# Patient Record
Sex: Female | Born: 1985 | Race: Black or African American | Hispanic: No | Marital: Single | Smoking: Former smoker
Health system: Southern US, Community
[De-identification: ages and names within clinical notes are randomized; demographics above are authoritative.]

## PROBLEM LIST (undated history)

## (undated) ENCOUNTER — Emergency Department: Admission: EM | Payer: Self-pay | Source: Home / Self Care

## (undated) ENCOUNTER — Inpatient Hospital Stay: Payer: Self-pay

## (undated) DIAGNOSIS — N61 Mastitis without abscess: Secondary | ICD-10-CM

## (undated) DIAGNOSIS — Z789 Other specified health status: Secondary | ICD-10-CM

## (undated) DIAGNOSIS — R638 Other symptoms and signs concerning food and fluid intake: Secondary | ICD-10-CM

## (undated) DIAGNOSIS — R87629 Unspecified abnormal cytological findings in specimens from vagina: Secondary | ICD-10-CM

## (undated) DIAGNOSIS — D573 Sickle-cell trait: Secondary | ICD-10-CM

## (undated) HISTORY — PX: NO PAST SURGERIES: SHX2092

## (undated) HISTORY — DX: Mastitis without abscess: N61.0

## (undated) HISTORY — DX: Unspecified abnormal cytological findings in specimens from vagina: R87.629

---

## 2013-09-05 ENCOUNTER — Emergency Department: Payer: Self-pay | Admitting: Emergency Medicine

## 2013-09-05 LAB — RAPID INFLUENZA A&B ANTIGENS (ARMC ONLY)

## 2013-09-06 LAB — BETA STREP CULTURE(ARMC)

## 2014-05-08 ENCOUNTER — Emergency Department: Payer: Self-pay | Admitting: Emergency Medicine

## 2014-05-08 LAB — COMPREHENSIVE METABOLIC PANEL
Albumin: 3.5 g/dL (ref 3.4–5.0)
Alkaline Phosphatase: 69 U/L
Anion Gap: 8 (ref 7–16)
BUN: 9 mg/dL (ref 7–18)
Bilirubin,Total: 0.4 mg/dL (ref 0.2–1.0)
CO2: 25 mmol/L (ref 21–32)
Calcium, Total: 8.4 mg/dL — ABNORMAL LOW (ref 8.5–10.1)
Chloride: 107 mmol/L (ref 98–107)
Creatinine: 0.77 mg/dL (ref 0.60–1.30)
GLUCOSE: 93 mg/dL (ref 65–99)
Osmolality: 278 (ref 275–301)
Potassium: 3.3 mmol/L — ABNORMAL LOW (ref 3.5–5.1)
SGOT(AST): 21 U/L (ref 15–37)
SGPT (ALT): 18 U/L
Sodium: 140 mmol/L (ref 136–145)
TOTAL PROTEIN: 7.2 g/dL (ref 6.4–8.2)

## 2014-05-08 LAB — CBC WITH DIFFERENTIAL/PLATELET
BASOS ABS: 0 10*3/uL (ref 0.0–0.1)
BASOS PCT: 0.5 %
EOS ABS: 0.6 10*3/uL (ref 0.0–0.7)
Eosinophil %: 6.8 %
HCT: 39.1 % (ref 35.0–47.0)
HGB: 13 g/dL (ref 12.0–16.0)
Lymphocyte #: 3.2 10*3/uL (ref 1.0–3.6)
Lymphocyte %: 39.3 %
MCH: 27.6 pg (ref 26.0–34.0)
MCHC: 33.2 g/dL (ref 32.0–36.0)
MCV: 83 fL (ref 80–100)
MONO ABS: 0.4 x10 3/mm (ref 0.2–0.9)
Monocyte %: 4.6 %
NEUTROS PCT: 48.8 %
Neutrophil #: 4 10*3/uL (ref 1.4–6.5)
Platelet: 284 10*3/uL (ref 150–440)
RBC: 4.71 10*6/uL (ref 3.80–5.20)
RDW: 14.2 % (ref 11.5–14.5)
WBC: 8.1 10*3/uL (ref 3.6–11.0)

## 2014-05-08 LAB — URINALYSIS, COMPLETE
Bacteria: NONE SEEN
Bilirubin,UR: NEGATIVE
Blood: NEGATIVE
GLUCOSE, UR: NEGATIVE mg/dL (ref 0–75)
Ketone: NEGATIVE
NITRITE: NEGATIVE
Ph: 5 (ref 4.5–8.0)
Protein: NEGATIVE
RBC,UR: NONE SEEN /HPF (ref 0–5)
Specific Gravity: 1.018 (ref 1.003–1.030)
Squamous Epithelial: 3
WBC UR: 1 /HPF (ref 0–5)

## 2014-05-08 LAB — HCG, QUANTITATIVE, PREGNANCY: Beta Hcg, Quant.: 18131 m[IU]/mL — ABNORMAL HIGH

## 2014-06-16 LAB — OB RESULTS CONSOLE GC/CHLAMYDIA
CHLAMYDIA, DNA PROBE: NEGATIVE
Gonorrhea: NEGATIVE

## 2014-06-20 LAB — OB RESULTS CONSOLE HIV ANTIBODY (ROUTINE TESTING)
HIV: NONREACTIVE
HIV: NONREACTIVE

## 2014-06-20 LAB — OB RESULTS CONSOLE ABO/RH: RH TYPE: POSITIVE

## 2014-06-20 LAB — OB RESULTS CONSOLE GC/CHLAMYDIA
Chlamydia: NEGATIVE
Gonorrhea: NEGATIVE

## 2014-06-20 LAB — OB RESULTS CONSOLE PLATELET COUNT
PLATELETS: 290 10*3/uL
Platelets: 290 10*3/uL

## 2014-06-20 LAB — OB RESULTS CONSOLE HGB/HCT, BLOOD
HCT: 33 %
HCT: 33 %
Hemoglobin: 11.1 g/dL
Hemoglobin: 11.1 g/dL

## 2014-06-20 LAB — OB RESULTS CONSOLE RPR
RPR: NONREACTIVE
RPR: NONREACTIVE

## 2014-06-20 LAB — OB RESULTS CONSOLE RUBELLA ANTIBODY, IGM: Rubella: IMMUNE

## 2014-06-20 LAB — OB RESULTS CONSOLE HEPATITIS B SURFACE ANTIGEN: HEP B S AG: NEGATIVE

## 2014-06-20 LAB — OB RESULTS CONSOLE VARICELLA ZOSTER ANTIBODY, IGG: Varicella: IMMUNE

## 2014-06-20 LAB — OB RESULTS CONSOLE ANTIBODY SCREEN: Antibody Screen: NEGATIVE

## 2014-06-22 LAB — HM PAP SMEAR: HM PAP: POSITIVE

## 2014-06-26 ENCOUNTER — Emergency Department: Payer: Self-pay | Admitting: Emergency Medicine

## 2014-06-26 LAB — CBC
HCT: 36.3 % (ref 35.0–47.0)
HGB: 11.7 g/dL — AB (ref 12.0–16.0)
MCH: 27.2 pg (ref 26.0–34.0)
MCHC: 32.4 g/dL (ref 32.0–36.0)
MCV: 84 fL (ref 80–100)
PLATELETS: 252 10*3/uL (ref 150–440)
RBC: 4.32 10*6/uL (ref 3.80–5.20)
RDW: 14 % (ref 11.5–14.5)
WBC: 5.8 10*3/uL (ref 3.6–11.0)

## 2014-06-26 LAB — URINALYSIS, COMPLETE
BLOOD: NEGATIVE
Bacteria: NONE SEEN
Bilirubin,UR: NEGATIVE
Glucose,UR: NEGATIVE mg/dL (ref 0–75)
Ketone: NEGATIVE
Nitrite: NEGATIVE
PROTEIN: NEGATIVE
Ph: 8 (ref 4.5–8.0)
RBC,UR: 1 /HPF (ref 0–5)
Specific Gravity: 1.013 (ref 1.003–1.030)
Squamous Epithelial: 19

## 2014-06-26 LAB — WET PREP, GENITAL

## 2014-06-26 LAB — HCG, QUANTITATIVE, PREGNANCY: Beta Hcg, Quant.: 96825 m[IU]/mL — ABNORMAL HIGH

## 2014-06-27 LAB — GC/CHLAMYDIA PROBE AMP

## 2014-08-02 DIAGNOSIS — R87629 Unspecified abnormal cytological findings in specimens from vagina: Secondary | ICD-10-CM

## 2014-08-02 HISTORY — DX: Unspecified abnormal cytological findings in specimens from vagina: R87.629

## 2014-09-01 NOTE — L&D Delivery Note (Signed)
Delivery Note At 10:52 PM a viable female was delivered via Vaginal, Spontaneous Delivery (Presentation: OP;  ).  APGAR: , ; weight  .   Placenta status: Expressed, .  Cord: 3 vessels with the following complications: None.  Cord pH: N/A  Anesthesia: None  Episiotomy: None Lacerations: None Suture Repair:N/A Est. Blood Loss (mL):  350  Mom to postpartum.  Baby to Couplet care / Skin to Skin.  Kaileen Bronkema 01/02/2015, 11:54 PM

## 2014-12-07 LAB — OB RESULTS CONSOLE GBS
STREP GROUP B AG: NEGATIVE
STREP GROUP B AG: NEGATIVE

## 2015-01-02 ENCOUNTER — Encounter: Payer: Self-pay | Admitting: Anesthesiology

## 2015-01-02 ENCOUNTER — Inpatient Hospital Stay
Admission: RE | Admit: 2015-01-02 | Discharge: 2015-01-04 | DRG: 775 | Disposition: A | Discharge status: Home / Self Care | Payer: Medicaid Other | Attending: Obstetrics and Gynecology | Admitting: Obstetrics and Gynecology

## 2015-01-02 ENCOUNTER — Encounter: Payer: Self-pay | Admitting: *Deleted

## 2015-01-02 HISTORY — DX: Sickle-cell trait: D57.3

## 2015-01-02 HISTORY — DX: Other symptoms and signs concerning food and fluid intake: R63.8

## 2015-01-02 HISTORY — DX: Other specified health status: Z78.9

## 2015-01-02 LAB — CBC
HCT: 33.6 % — ABNORMAL LOW (ref 35.0–47.0)
Hemoglobin: 11.2 g/dL — ABNORMAL LOW (ref 12.0–16.0)
MCH: 26.3 pg (ref 26.0–34.0)
MCHC: 33.3 g/dL (ref 32.0–36.0)
MCV: 78.9 fL — ABNORMAL LOW (ref 80.0–100.0)
Platelets: 214 10*3/uL (ref 150–440)
RBC: 4.26 MIL/uL (ref 3.80–5.20)
RDW: 15 % — AB (ref 11.5–14.5)
WBC: 13.2 10*3/uL — ABNORMAL HIGH (ref 3.6–11.0)

## 2015-01-02 MED ORDER — OXYTOCIN 40 UNITS IN LACTATED RINGERS INFUSION - SIMPLE MED
1.0000 m[IU]/min | INTRAVENOUS | Status: DC
  Administered 2015-01-02: 4 m[IU]/min via INTRAVENOUS
  Administered 2015-01-02: 2 m[IU]/min via INTRAVENOUS

## 2015-01-02 MED ORDER — LIDOCAINE HCL (PF) 1 % IJ SOLN
INTRAMUSCULAR | Status: AC
  Filled 2015-01-02: qty 30

## 2015-01-02 MED ORDER — AMMONIA AROMATIC IN INHA
RESPIRATORY_TRACT | Status: AC
  Filled 2015-01-02: qty 10

## 2015-01-02 MED ORDER — OXYTOCIN 40 UNITS IN LACTATED RINGERS INFUSION - SIMPLE MED
INTRAVENOUS | Status: AC
  Filled 2015-01-02: qty 1000

## 2015-01-02 MED ORDER — TERBUTALINE SULFATE 1 MG/ML IJ SOLN: 0.2500 mg | Freq: Once | INTRAMUSCULAR | Status: AC | PRN

## 2015-01-02 MED ORDER — MISOPROSTOL 200 MCG PO TABS
ORAL_TABLET | ORAL | Status: AC
  Filled 2015-01-02: qty 1

## 2015-01-02 MED ORDER — FENTANYL 2.5 MCG/ML W/ROPIVACAINE 0.2% IN NS 100 ML EPIDURAL INFUSION (ARMC-ANES)
EPIDURAL | Status: AC
  Filled 2015-01-02: qty 100

## 2015-01-02 MED ORDER — OXYTOCIN 10 UNIT/ML IJ SOLN
INTRAMUSCULAR | Status: AC
  Filled 2015-01-02: qty 1

## 2015-01-02 NOTE — ED Notes (Signed)
Pt was discharged to l&d via wc

## 2015-01-02 NOTE — Progress Notes (Signed)
Terri Bell is a 29 y.o. G2P0 at Unknown by ultrasound admitted for active labor  Subjective:   Objective: There were no vitals taken for this visit.      FHT:  FHR: reassuring; h/o prior 2.5 min deceleration, resolved   SVE:    4-5/90/-3/AROM Lt Mec  Labs: Lab Results  Component Value Date   WBC 5.8 06/26/2014   HGB 11.7* 06/26/2014   HCT 36.3 06/26/2014   MCV 84 06/26/2014   PLT 252 06/26/2014    Assessment / Plan: AROM done for spontaneous deceleration. IUPC placed.  Labor: Pitocin augmentation as needed  DEFRANCESCO, MARTIN 01/02/2015, 6:30 PM

## 2015-01-02 NOTE — Anesthesia Preprocedure Evaluation (Deleted)
Anesthesia Evaluation  Patient identified by MRN, date of birth, ID band Patient awake    Reviewed: Allergy & Precautions, H&P , NPO status , Patient's Chart, lab work & pertinent test results  Airway        Dental   Pulmonary          Cardiovascular     Neuro/Psych    GI/Hepatic   Endo/Other    Renal/GU      Musculoskeletal   Abdominal   Peds  Hematology   Anesthesia Other Findings   Reproductive/Obstetrics                             Anesthesia Physical Anesthesia Plan Anesthesia Quick Evaluation

## 2015-01-02 NOTE — H&P (Signed)
Terri PiliKarena M Bell is a 29 y.o. female presenting for Labor management. Maternal Medical History:  Reason for admission: Contractions.   Contractions: Onset was 3-5 hours ago.   Frequency: irregular.   Perceived severity is moderate.    Fetal activity: Perceived fetal activity is normal.   Last perceived fetal movement was within the past hour.      OB History    Gravida Para Term Preterm AB TAB SAB Ectopic Multiple Living   2         1     Past Medical History  Diagnosis Date  . Medical history non-contributory    Past Surgical History  Procedure Laterality Date  . No past surgeries     Family History: family history is not on file. Social History:  reports that she has never smoked. She has never used smokeless tobacco. She reports that she does not drink alcohol or use illicit drugs.   Prenatal Transfer Tool  Maternal Diabetes: No Genetic Screening: Normal Maternal Ultrasounds/Referrals: Normal Fetal Ultrasounds or other Referrals:  None Maternal Substance Abuse:  No Significant Maternal Medications:  None Significant Maternal Lab Results:  None Other Comments:  None  Review of Systems  All other systems reviewed and are negative.   Dilation: 4 Effacement (%): 90 Station: -1 Exam by:: Defrancesco There were no vitals taken for this visit. Maternal Exam:  Uterine Assessment: Contraction strength is moderate.  Contraction frequency is irregular.   Abdomen: Patient reports no abdominal tenderness. Fundal height is 41.   Fetal presentation: vertex  Introitus: Normal vulva. Normal vagina.  Pelvis: adequate for delivery.   Cervix: Cervix evaluated by digital exam.     Physical Exam  Constitutional: She is oriented to person, place, and time. She appears well-developed and well-nourished.  HENT:  Head: Normocephalic and atraumatic.  Neck: Neck supple.  Cardiovascular: Normal rate and regular rhythm.   Respiratory: Breath sounds normal.  GI: There is no  tenderness.  Genitourinary: Vagina normal and uterus normal.  Musculoskeletal: Normal range of motion.  Neurological: She is alert and oriented to person, place, and time.  Skin: Skin is warm and dry.    Prenatal labs: ABO, Rh: B/Positive/-- (10/20 0000) Antibody: Negative (10/20 0000) Rubella: Immune (10/20 0000) RPR: Nonreactive, Nonreactive (10/20 0000)  HBsAg: Negative (10/20 0000)  HIV: Non-reactive, Non-reactive (10/20 0000)  GBS: Negative, Negative (04/07 0000)   Assessment/Plan: 1. TIUP in Labor 2. Sickle Trait 3. Increased BMI  Anticipate SVD  DEFRANCESCO, MARTIN 01/02/2015, 6:57 PM

## 2015-01-03 ENCOUNTER — Encounter: Payer: Self-pay | Admitting: *Deleted

## 2015-01-03 LAB — CBC
HCT: 34.6 % — ABNORMAL LOW (ref 35.0–47.0)
Hemoglobin: 11.2 g/dL — ABNORMAL LOW (ref 12.0–16.0)
MCH: 25.9 pg — ABNORMAL LOW (ref 26.0–34.0)
MCHC: 32.4 g/dL (ref 32.0–36.0)
MCV: 79.9 fL — ABNORMAL LOW (ref 80.0–100.0)
PLATELETS: 222 10*3/uL (ref 150–440)
RBC: 4.33 MIL/uL (ref 3.80–5.20)
RDW: 15.1 % — AB (ref 11.5–14.5)
WBC: 19 10*3/uL — AB (ref 3.6–11.0)

## 2015-01-03 MED ORDER — OXYTOCIN 40 UNITS IN LACTATED RINGERS INFUSION - SIMPLE MED
999.0000 mL/h | Freq: Once | INTRAVENOUS | Status: DC
Start: 2015-01-03 — End: 2015-01-03

## 2015-01-03 MED ORDER — OXYCODONE-ACETAMINOPHEN 5-325 MG PO TABS: 2.0000 | ORAL_TABLET | ORAL | Status: DC | PRN

## 2015-01-03 MED ORDER — MEASLES, MUMPS & RUBELLA VAC ~~LOC~~ INJ: 0.5000 mL | INJECTION | Freq: Once | SUBCUTANEOUS | Status: DC

## 2015-01-03 MED ORDER — ACETAMINOPHEN 325 MG PO TABS: 650.0000 mg | ORAL_TABLET | ORAL | Status: DC | PRN

## 2015-01-03 MED ORDER — DIBUCAINE 1 % RE OINT: 1.0000 "application " | TOPICAL_OINTMENT | RECTAL | Status: DC | PRN

## 2015-01-03 MED ORDER — TETANUS-DIPHTH-ACELL PERTUSSIS 5-2.5-18.5 LF-MCG/0.5 IM SUSP: 0.5000 mL | INTRAMUSCULAR | Status: DC | PRN

## 2015-01-03 MED ORDER — OXYTOCIN 10 UNIT/ML IJ SOLN: 40.0000 [IU] | INTRAVENOUS | Status: DC

## 2015-01-03 MED ORDER — OXYTOCIN 40 UNITS IN LACTATED RINGERS INFUSION - SIMPLE MED
999.0000 mL/h | Freq: Once | INTRAVENOUS | Status: AC
  Administered 2015-01-02: 999 mL/h via INTRAVENOUS

## 2015-01-03 MED ORDER — IBUPROFEN 800 MG PO TABS
800.0000 mg | ORAL_TABLET | Freq: Three times a day (TID) | ORAL | Status: DC
  Administered 2015-01-03 – 2015-01-04 (×2): 800 mg via ORAL
  Filled 2015-01-03 (×3): qty 1

## 2015-01-03 MED ORDER — ONDANSETRON HCL 4 MG PO TABS: 4.0000 mg | ORAL_TABLET | ORAL | Status: DC | PRN

## 2015-01-03 MED ORDER — OXYCODONE-ACETAMINOPHEN 5-325 MG PO TABS
1.0000 | ORAL_TABLET | ORAL | Status: DC | PRN
Start: 2015-01-03 — End: 2015-01-04

## 2015-01-03 MED ORDER — WITCH HAZEL-GLYCERIN EX PADS: 1.0000 "application " | MEDICATED_PAD | CUTANEOUS | Status: DC | PRN

## 2015-01-03 MED ORDER — LANOLIN HYDROUS EX OINT
TOPICAL_OINTMENT | CUTANEOUS | Status: DC | PRN
Start: 2015-01-03 — End: 2015-01-04

## 2015-01-03 MED ORDER — FERROUS SULFATE 325 (65 FE) MG PO TABS
325.0000 mg | ORAL_TABLET | Freq: Two times a day (BID) | ORAL | Status: DC
  Administered 2015-01-03 – 2015-01-04 (×3): 325 mg via ORAL
  Filled 2015-01-03 (×3): qty 1

## 2015-01-03 MED ORDER — DIPHENHYDRAMINE HCL 25 MG PO CAPS: 25.0000 mg | ORAL_CAPSULE | Freq: Four times a day (QID) | ORAL | Status: DC | PRN

## 2015-01-03 MED ORDER — BENZOCAINE-MENTHOL 20-0.5 % EX AERO: 1.0000 "application " | INHALATION_SPRAY | CUTANEOUS | Status: DC | PRN

## 2015-01-03 MED ORDER — ONDANSETRON HCL 4 MG/2ML IJ SOLN: 4.0000 mg | INTRAMUSCULAR | Status: DC | PRN

## 2015-01-03 MED ORDER — SENNOSIDES-DOCUSATE SODIUM 8.6-50 MG PO TABS
2.0000 | ORAL_TABLET | ORAL | Status: DC
  Administered 2015-01-03 – 2015-01-04 (×2): 2 via ORAL
  Filled 2015-01-03 (×3): qty 2

## 2015-01-03 MED ORDER — PRENATAL MULTIVITAMIN CH
1.0000 | ORAL_TABLET | Freq: Every day | ORAL | Status: DC
  Administered 2015-01-03: 1 via ORAL
  Filled 2015-01-03: qty 1

## 2015-01-03 MED ORDER — SIMETHICONE 80 MG PO CHEW: 80.0000 mg | CHEWABLE_TABLET | ORAL | Status: DC | PRN

## 2015-01-03 NOTE — Progress Notes (Signed)
Post Partum Day 0 Subjective: no complaints  Objective: Blood pressure 108/67, pulse 73, temperature 98.6 F (37 C), temperature source Oral, resp. rate 20, height 5\' 2"  (1.575 m), SpO2 98 %, unknown if currently breastfeeding.  Physical Exam:  General: cooperative and no distress Lochia: appropriate Uterine Fundus: firm Incision: N/A DVT Evaluation: No evidence of DVT seen on physical exam.   Recent Labs  01/02/15 2229 01/03/15 0557  HGB 11.2* 11.2*  HCT 33.6* 34.6*    Assessment/Plan: Plan for discharge tomorrow   LOS: 1 day   Terri Bell 01/03/2015, 7:45 AM

## 2015-01-04 LAB — RPR: RPR: NONREACTIVE

## 2015-01-04 MED ORDER — OXYCODONE-ACETAMINOPHEN 5-325 MG PO TABS: 1.0000 | ORAL_TABLET | ORAL | Status: DC | PRN

## 2015-01-04 MED ORDER — IBUPROFEN 800 MG PO TABS: 800.0000 mg | ORAL_TABLET | Freq: Three times a day (TID) | ORAL | Status: DC

## 2015-01-04 NOTE — Discharge Instructions (Signed)
Please call to schedule a 6 week post partum check up with your prenatal care provider

## 2015-01-10 NOTE — Discharge Summary (Signed)
Physician Obstetric Discharge Summary  Patient ID: Terri Bell Leclere MRN: [id number] DOB/AGE: May 02, 1986 29 y.o.   Date of Admission: 01/02/2015  Date of Discharge: 01/04/2015  Admitting Diagnosis: Induction of labor at 5539w3d    Mode of Delivery: normal spontaneous vaginal delivery5/3/16  SVD     Discharge Diagnosis: TIUP, delivered; 's Additions trait;Increased BMI; S/P SVD   Intrapartum Procedures: Atificial rupture of membranes and pitocin augmentation   Post partum procedures: NONE  Complications: none   Brief Hospital Course  Terri Bell Simons is a [id number] who had a SVD on 5/3//16;  for further details of this surgery, please refer to the delivey note.  Patient had an uncomplicated postpartum course.  By time of discharge on PPD#2, her pain was controlled on oral pain medications; she had appropriate lochia and was ambulating, voiding without difficulty and tolerating regular diet.  She was deemed stable for discharge to home.     Labs: CBC Latest Ref Rng 01/03/2015 01/02/2015 06/26/2014  WBC 3.6 - 11.0 K/uL 19.0(H) 13.2(H) 5.8  Hemoglobin 12.0 - 16.0 g/dL 11.2(L) 11.2(L) 11.7(L)  Hematocrit 35.0 - 47.0 % 34.6(L) 33.6(L) 36.3  Platelets 150 - 440 K/uL 222 214 252   B  Physical exam:  Blood pressure 102/62, pulse 59, temperature 98.3 F (36.8 C), temperature source Oral, resp. rate 18, height 5\' 2"  (1.575 Bell), SpO2 99 %, unknown if currently breastfeeding. General: alert and no distress Lochia: appropriate Abdomen: soft, NT  Extremities: No evidence of DVT seen on physical exam. No lower extremity edema.  Discharge Instructions: Per After Visit Summary. Activity: Advance as tolerated. Pelvic rest for 6 weeks.  Also refer to After Visit Summary Diet: Regular Medications:   Medication List    TAKE these medications        calcium carbonate 500 MG chewable tablet  Commonly known as:  TUMS - dosed in mg elemental calcium  Chew 3 tablets by mouth 2 (two) times daily as needed for  indigestion or heartburn.     ibuprofen 800 MG tablet  Commonly known as:  ADVIL,MOTRIN  Take 1 tablet (800 mg total) by mouth 3 (three) times daily.     oxyCODONE-acetaminophen 5-325 MG per tablet  Commonly known as:  PERCOCET/ROXICET  Take 1-2 tablets by mouth every 4 (four) hours as needed (for pain scale greater than 7).     prenatal multivitamin Tabs tablet  Take 1 tablet by mouth daily.       Outpatient follow up:  Postpartum contraception: condoms  Discharged Condition: good  Discharged to: home   Newborn Data: Disposition:home with mother  Apgars: APGAR (1 MIN): 8   APGAR (5 MINS): 9   APGAR (10 MINS):    Baby Feeding: Breast  Jaynie CollinsUGONNA  ANYANWU, MD, FACOG Attending Obstetrician & Gynecologist Faculty Practice, [facility]

## 2015-02-15 ENCOUNTER — Ambulatory Visit (INDEPENDENT_AMBULATORY_CARE_PROVIDER_SITE_OTHER): Payer: Medicaid Other | Admitting: Obstetrics and Gynecology

## 2015-02-15 ENCOUNTER — Encounter: Payer: Self-pay | Admitting: Obstetrics and Gynecology

## 2015-02-15 DIAGNOSIS — Z30018 Encounter for initial prescription of other contraceptives: Secondary | ICD-10-CM

## 2015-02-15 DIAGNOSIS — R87610 Atypical squamous cells of undetermined significance on cytologic smear of cervix (ASC-US): Secondary | ICD-10-CM | POA: Insufficient documentation

## 2015-02-15 DIAGNOSIS — R87810 Cervical high risk human papillomavirus (HPV) DNA test positive: Secondary | ICD-10-CM

## 2015-02-15 DIAGNOSIS — R896 Abnormal cytological findings in specimens from other organs, systems and tissues: Secondary | ICD-10-CM

## 2015-02-15 DIAGNOSIS — D649 Anemia, unspecified: Secondary | ICD-10-CM

## 2015-02-15 DIAGNOSIS — IMO0002 Reserved for concepts with insufficient information to code with codable children: Secondary | ICD-10-CM

## 2015-02-15 NOTE — Patient Instructions (Signed)
1.  Resume all activities without restriction. 2.  Begin using NuvaRing monthly; use backup condoms for the first month. 3.  CBC to assess anemia. 4.  RTC 3 months for colposcopy to assess ASCUS/positive Pap smear. 5.  RTC 6 months for annual exam.

## 2015-02-15 NOTE — Progress Notes (Signed)
Patient ID: Terri Bell, female   DOB: Jul 17, 1986, 29 y.o.   MRN: [id number]   Subjective:    Terri Bell is a 29 y.o. [id number] African American female who presents for a postpartum visit. She is 6  weeks postpartum following a spontaneous vaginal delivery at 39 gestational weeks. Anesthesia: none. I have fully reviewed the prenatal and intrapartum course. Postpartum course has been normal . Baby's course has been normal. Baby is feeding by bottle - Enfamil with Iron. Bleeding no bleeding. Bowel function is normal. Bladder function is normal. Patient is not sexually active. Last sexual activity: before 6 months ago. Contraception method is Nuvaring. Postpartum depression screening: negative. Score 5.  Last pap 06/22/2014 and was ascus/pos.  The following portions of the patient's history were reviewed and updated as appropriate: allergies, current medications, past medical history, past surgical history and problem list.  Review of Systems Pertinent items are noted in HPI.   There were no vitals filed for this visit. No LMP recorded.  Objective:   General:  alert, cooperative and no distress   Breasts:  Normal exam without masses, adenopathy or nipple discharge  Lungs: clear to auscultation bilaterally  Heart:  regular rate and rhythm  Abdomen: soft, non tender   Vulva: normal  Vagina: normal vagina  Cervix:  closed  Corpus: Well-involuted  Adnexa:  Non-palpable  Rectal Exam: No hemorrhoids        Assessment:   Postpartum exam 6 wks s/p  Bottle feeding Depression screening-negative Contraception counseling -NuvaRing  Started Abnormal Pap smear  Plan:   Contraception: NuvaRing Follow up in: 6 months for annual exam or earlier if needed Three-month colposcopy for 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

## 2015-02-16 MED ORDER — ETONOGESTREL-ETHINYL ESTRADIOL 0.12-0.015 MG/24HR VA RING: VAGINAL_RING | VAGINAL | Status: DC

## 2015-05-17 ENCOUNTER — Ambulatory Visit (INDEPENDENT_AMBULATORY_CARE_PROVIDER_SITE_OTHER): Payer: Medicaid Other | Admitting: Obstetrics and Gynecology

## 2015-05-17 ENCOUNTER — Encounter: Payer: Self-pay | Admitting: Obstetrics and Gynecology

## 2015-05-17 VITALS — BP 138/79 | HR 80 | Ht 62.5 in | Wt 174.8 lb

## 2015-05-17 DIAGNOSIS — R896 Abnormal cytological findings in specimens from other organs, systems and tissues: Secondary | ICD-10-CM

## 2015-05-17 DIAGNOSIS — IMO0002 Reserved for concepts with insufficient information to code with codable children: Secondary | ICD-10-CM

## 2015-05-17 NOTE — Progress Notes (Signed)
Patient ID: Terri Bell, female   DOB: 08-30-1986, 29 y.o.   MRN: [id number] colpo  ascus/pos   Chief complaint: 1.ASCUS/positive  Pap smear  Patient is a nonsmoker.  She does have multiple partners.  She is using NuvaRing for contraception  OBJECTIVE: BP 138/79 mmHg  Pulse 80  Ht 5' 2.5" (1.588 m)  Wt 174 lb 12.8 oz (79.289 kg)  BMI 31.44 kg/m2  LMP 05/08/2015  Breastfeeding? No Pelvic exam: External genitalia-normal BUS-normal Vagina- normal; No lesions Cervix-parous, eversion present, no lesions Uterus-retroverted.  Colposcopy Procedure Note  Indications: Pap smear 06/22/2014 showed: no abnormalities. The prior pap showed ASCUS with POSITIVE high risk HPV.  Prior cervical/vaginal disease: normal exam without visible pathology. Prior cervical treatment: no treatment.  Procedure Details  The risks and benefits of the procedure and Verbal informed consent obtained.  Speculum placed in vagina and excellent visualization of cervix achieved, cervix swabbed x 3 with acetic acid solution.  Findings: Cervix: no visible lesions; SCJ visualized 360 degrees without lesions and no biopsies taken. Vaginal inspection: vaginal colposcopy not performed. Vulvar colposcopy: vulvar colposcopy not performed.  Specimens: Pap, if ASCUS  Reflex  Complications: none.  Plan: Treatment options discussed with patient. Return in 6 months for repeat Pap smear.  Annual exam will be done at that time.  Herold Harms, MD

## 2015-05-17 NOTE — Addendum Note (Signed)
Addended by: Marchelle Folks on: 05/17/2015 10:46 AM   Modules accepted: Orders

## 2015-05-17 NOTE — Patient Instructions (Addendum)
1.  Return in 6 months for annual exam and Pap smear. 2.  Condom use with new partners.

## 2015-05-21 ENCOUNTER — Encounter: Payer: Self-pay | Admitting: Obstetrics and Gynecology

## 2015-05-21 LAB — PAP IG W/ RFLX HPV ASCU: PAP Smear Comment: 0

## 2015-05-22 ENCOUNTER — Encounter: Payer: Medicaid Other | Admitting: Obstetrics and Gynecology

## 2015-06-12 ENCOUNTER — Encounter: Payer: Self-pay | Admitting: Obstetrics and Gynecology

## 2015-06-12 ENCOUNTER — Ambulatory Visit (INDEPENDENT_AMBULATORY_CARE_PROVIDER_SITE_OTHER): Payer: Medicaid Other | Admitting: Obstetrics and Gynecology

## 2015-06-12 VITALS — BP 118/76 | HR 74 | Ht 62.0 in | Wt 173.8 lb

## 2015-06-12 DIAGNOSIS — A599 Trichomoniasis, unspecified: Secondary | ICD-10-CM

## 2015-06-12 MED ORDER — METRONIDAZOLE 500 MG PO TABS: 500.0000 mg | ORAL_TABLET | Freq: Two times a day (BID) | ORAL | Status: DC

## 2015-06-12 NOTE — Progress Notes (Signed)
Chief complaint: 1.  Abnormal Pap smear with Trichomonas vaginalis present. 2.  Vaginal discharge.  Patient presents for follow-up on vaginal discharge and Trichomonas vaginalis, which was seen on Pap smear.  She is just finishing her menses.  Now, but has had persistent discharge.  Past medical history, past surgical history, problem list, medications, allergies are reviewed.  Review of Systems  Constitutional: Negative.   Gastrointestinal: Negative.   Genitourinary: Negative.        Vaginal discharge present; just starting menses  Skin: Negative.   Endo/Heme/Allergies: Negative.    OBJECTIVE: BP 118/76 mmHg  Pulse 74  Ht  (1.575 m)  Wt 173 lb 12.8 oz (78.835 kg)  BMI 31.78 kg/m2  LMP 06/07/2015  Breastfeeding? No  Pleasant, well-appearing.  Abdomen rare female in no acute distress. Abdomen: Soft, nontender. Pelvic exam:  External genitalia-normal.  , BUS-normal.  Vagina-minimal bloody brown discharge  Cervix-normal  Uterus-normal  Procedure: Wet prep-normal saline: TNTC white blood cells; positive Trichomonas.  KOH: Negative  IMPRESSION: Trichomonas vaginalis.  PLAN: 1.  Metronidazole 500 mg twice a day for 7 days 2.  Partner needs treatment

## 2015-06-12 NOTE — Patient Instructions (Signed)
1.  Metronidazole 500 mg twice a day for 7 days. 2.  Partner needs to be treated in order to prevent recurrence of infection. 3.  Follow up as needed

## 2015-07-18 IMAGING — US US OB < 14 WEEKS
1 series · 14 of 28 positions shown · non-contrast
Comparison: 05/09/2014

CLINICAL DATA: First trimester pregnancy, bleeding for 24 hr,
discomfort, quantitative beta HCG [DATE], initial evaluation

EXAM:
OBSTETRIC <14 WK ULTRASOUND
TECHNIQUE: Transabdominal ultrasound was performed for evaluation of the
gestation as well as the maternal uterus and adnexal regions.

[Series 1: us ob < 14 weeks · 0.20mm/px · 38 acquisitions, 14 frames shown]
[im 2/38]
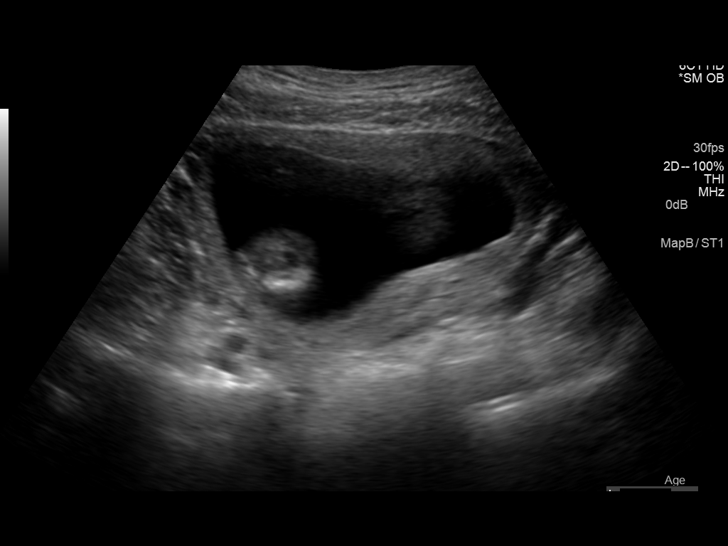
[im 5/38]
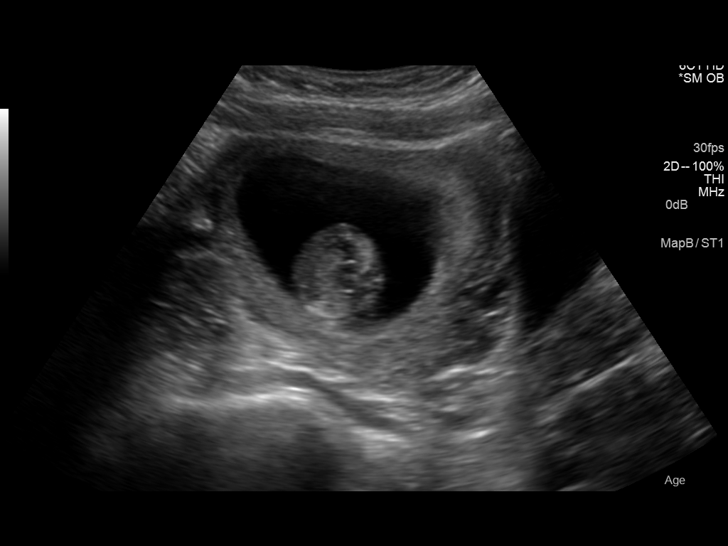
[im 7/38]
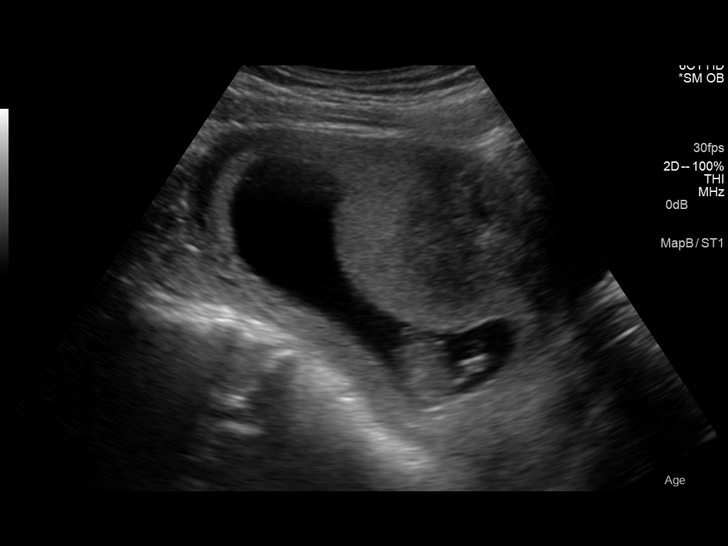
[im 10/38]
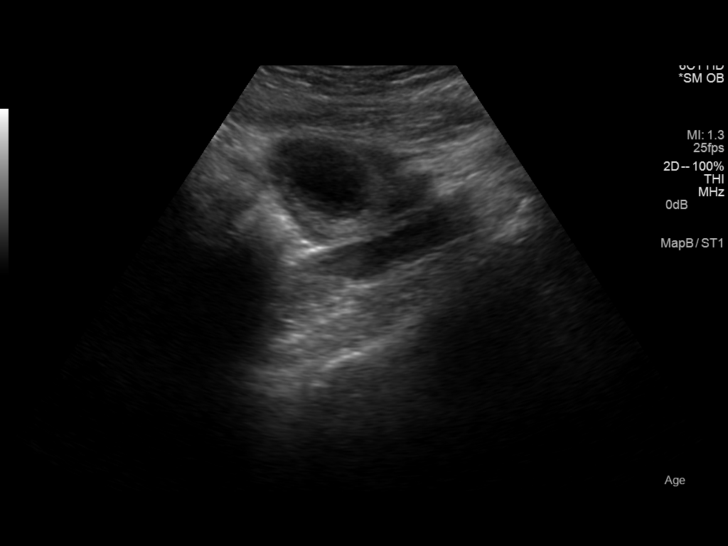
[im 13/38]
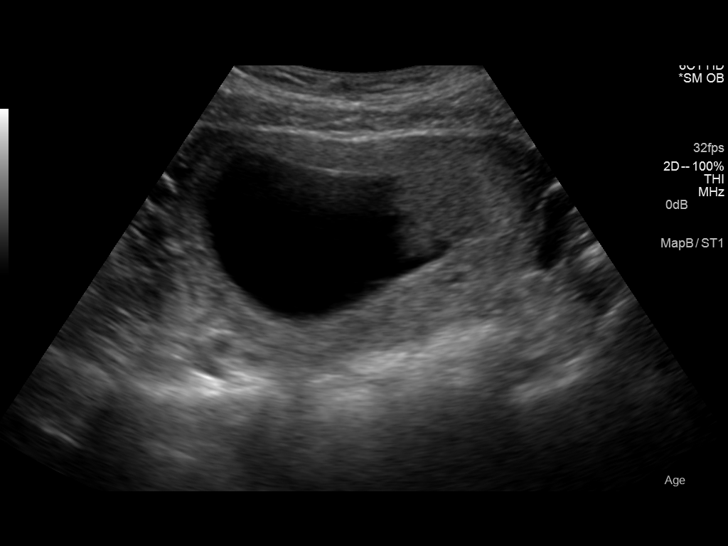
[im 16/38]
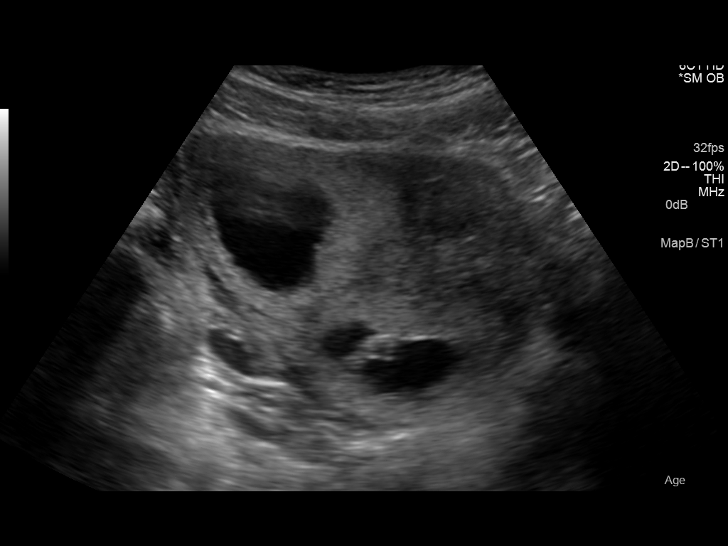
[im 18/38]
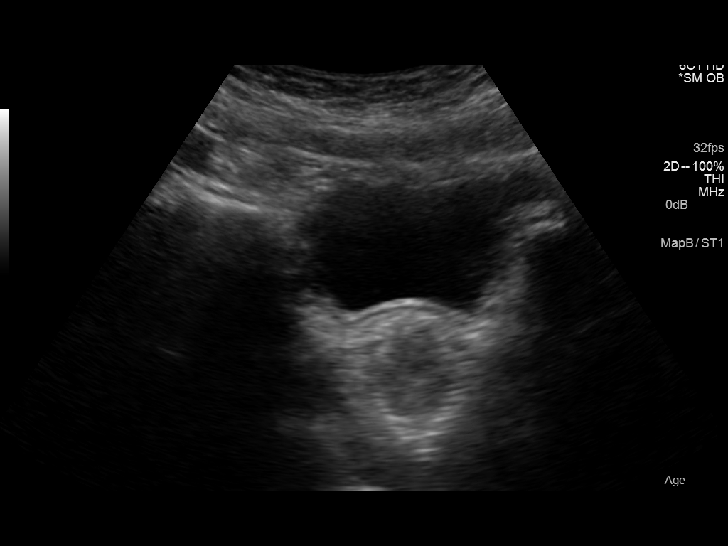
[im 21/38]
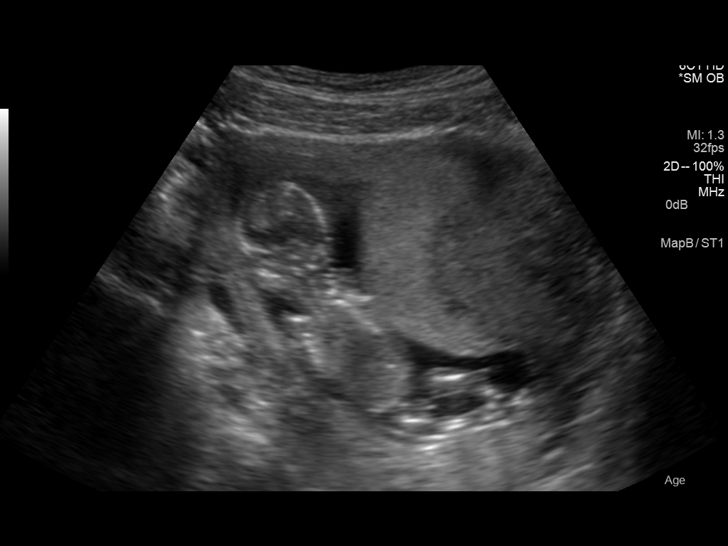
[im 24/38]
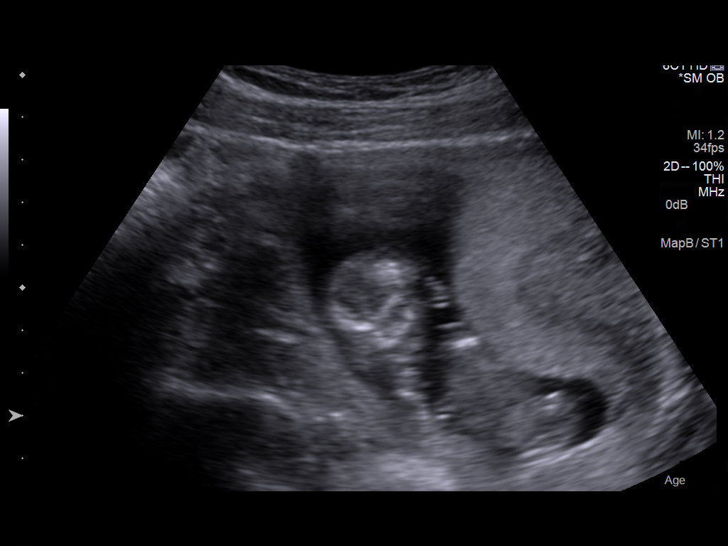
[im 27/38]
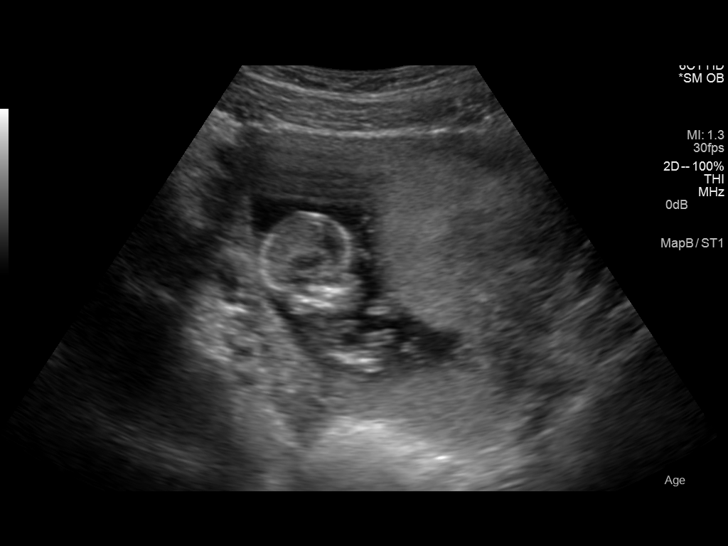
[im 29/38]
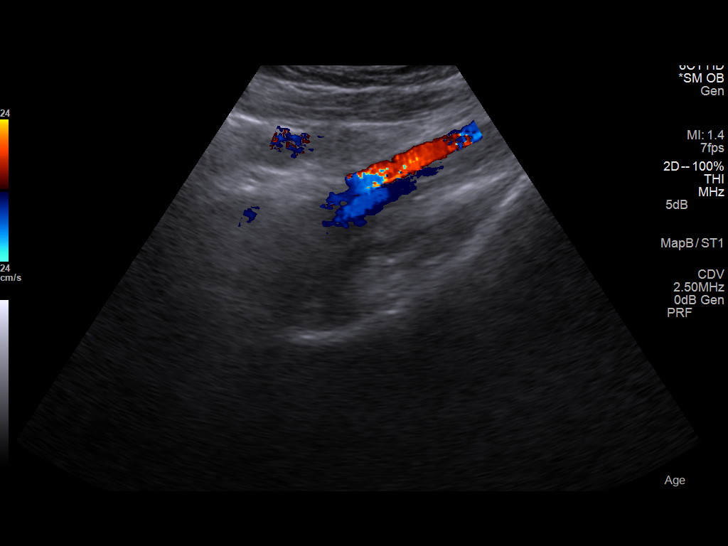
[im 32/38]
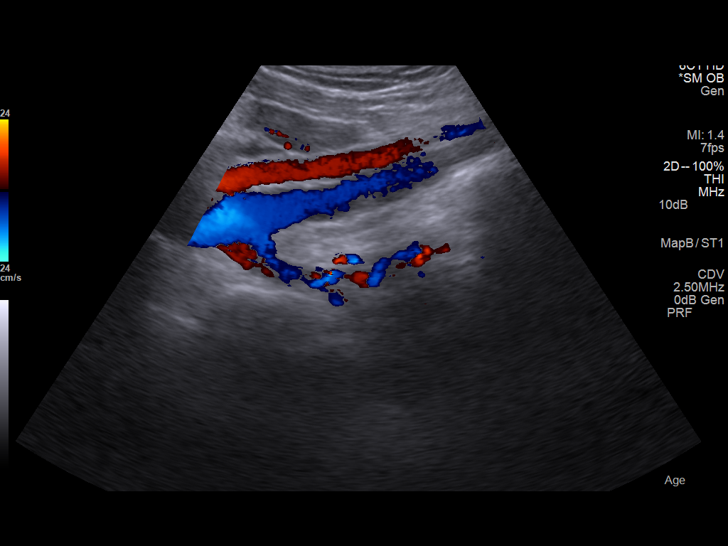
[im 35/38]
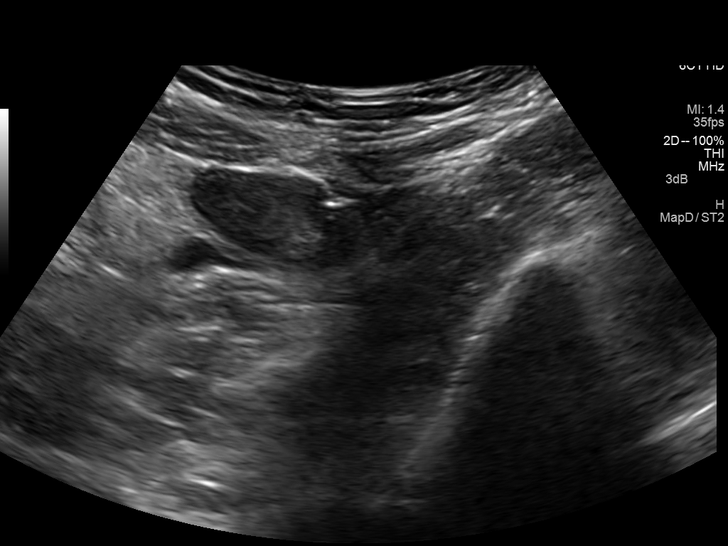
[im 38/38]
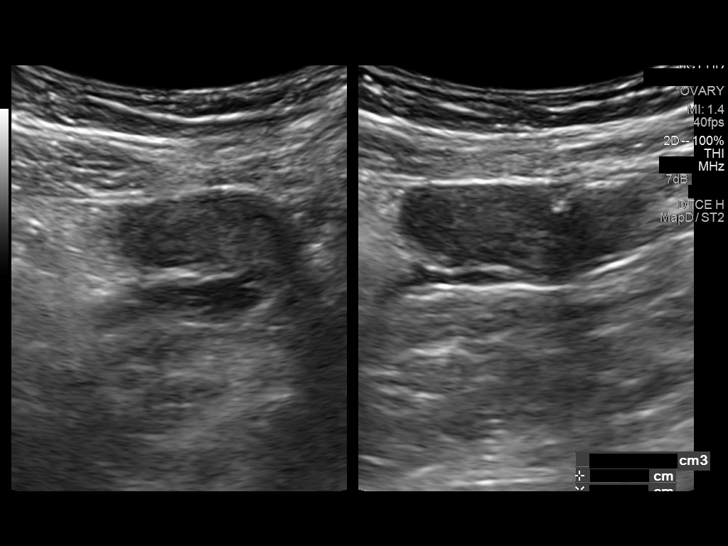

[14 of 28 positions shown; findings below may reference images not displayed]

FINDINGS: Intrauterine gestational sac: Visualized/normal in shape.

Yolk sac:  Not identified

Embryo:  Present

Cardiac Activity: Present

Heart Rate: 157 bpm

CRL:   64  mm   12 w 6 d                  US EDC: 01/29/2015

Maternal uterus/adnexae: No subchorionic hemorrhage. Normal left
ovary. No free fluid. Right ovary not identified.
IMPRESSION: Live intrauterine gestation estimated at 12 weeks 6 day gestational
age with no abnormalities.

## 2015-08-16 ENCOUNTER — Encounter: Payer: Medicaid Other | Admitting: Obstetrics and Gynecology

## 2015-09-02 NOTE — L&D Delivery Note (Signed)
Delivery Summary for Terri Bell  Labor Events:   Preterm labor:   Rupture date:   Rupture time:   Rupture type: Artificial  Fluid Color: Clear  Induction:   Augmentation:   Complications:   Cervical ripening:          Delivery:   Episiotomy:   Lacerations:   Repair suture:   Repair # of packets:   Blood loss (ml): 200   Information for the patient's newborn:  [name], Boy [name] [[id number]][[id number]]    Delivery 05/24/2016 12:14 PM by  Vaginal, Spontaneous Delivery Sex:  female Gestational Age: 6929w6d Delivery Clinician:   Living?:         APGARS  One minute Five minutes Ten minutes  Skin color:        Heart rate:        Grimace:        Muscle tone:        Breathing:        Totals: 9  9      Presentation/position:      Resuscitation:   Cord information:    Disposition of cord blood:     Blood gases sent?  Complications:   Placenta: Delivered:       appearance Newborn Measurements: Weight: 6 lb 13 oz (3090 g)  Height: 19.88"  Head circumference:    Chest circumference:    Other providers:    Additional  information: Forceps:   Vacuum:   Breech:   Observed anomalies        Delivery Note At 12:14 PM a viable and healthy female was delivered via Vaginal, Spontaneous Delivery (Presentation: Vertex; LOA position).  APGAR: 9, 9; weight 6 lb 13 oz (3090 g).   Placenta status: spontaneously removed, intact.  Cord: 3-vessel, with the following complications: None.  Cord pH: not obtained.   Anesthesia: None Episiotomy: None Lacerations: None Suture Repair: None Est. Blood Loss (mL):  200  Mom to postpartum.  Baby to Couplet care / Skin to Skin.  [name] [name] 05/24/2016, 1:51 PM      Hildred LaserAnika Gailene Youkhana, MD [facility]

## 2015-10-16 ENCOUNTER — Emergency Department: Payer: No Typology Code available for payment source

## 2015-10-16 ENCOUNTER — Emergency Department
Admission: EM | Admit: 2015-10-16 | Discharge: 2015-10-16 | Disposition: A | Discharge status: Home / Self Care | Payer: No Typology Code available for payment source | Attending: Emergency Medicine | Admitting: Emergency Medicine

## 2015-10-16 ENCOUNTER — Encounter: Payer: Self-pay | Admitting: Medical Oncology

## 2015-10-16 DIAGNOSIS — Y998 Other external cause status: Secondary | ICD-10-CM | POA: Diagnosis not present

## 2015-10-16 DIAGNOSIS — Z792 Long term (current) use of antibiotics: Secondary | ICD-10-CM | POA: Insufficient documentation

## 2015-10-16 DIAGNOSIS — Z793 Long term (current) use of hormonal contraceptives: Secondary | ICD-10-CM | POA: Insufficient documentation

## 2015-10-16 DIAGNOSIS — Y9389 Activity, other specified: Secondary | ICD-10-CM | POA: Insufficient documentation

## 2015-10-16 DIAGNOSIS — S8991XA Unspecified injury of right lower leg, initial encounter: Secondary | ICD-10-CM | POA: Diagnosis present

## 2015-10-16 DIAGNOSIS — S8001XA Contusion of right knee, initial encounter: Secondary | ICD-10-CM | POA: Insufficient documentation

## 2015-10-16 DIAGNOSIS — Y92410 Unspecified street and highway as the place of occurrence of the external cause: Secondary | ICD-10-CM | POA: Insufficient documentation

## 2015-10-16 DIAGNOSIS — Z880 Allergy status to penicillin: Secondary | ICD-10-CM | POA: Diagnosis not present

## 2015-10-16 MED ORDER — TRAMADOL HCL 50 MG PO TABS: 50.0000 mg | ORAL_TABLET | Freq: Four times a day (QID) | ORAL | Status: DC | PRN

## 2015-10-16 MED ORDER — IBUPROFEN 600 MG PO TABS: 600.0000 mg | ORAL_TABLET | Freq: Three times a day (TID) | ORAL | Status: DC | PRN
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
## 2015-10-16 NOTE — Discharge Instructions (Signed)
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
## 2015-10-22 ENCOUNTER — Telehealth: Payer: Self-pay | Admitting: Obstetrics and Gynecology

## 2015-10-22 MED ORDER — DOXYLAMINE SUCCINATE (SLEEP) 25 MG PO TABS: 12.5000 mg | ORAL_TABLET | Freq: Every evening | ORAL | Status: DC | PRN

## 2015-10-22 MED ORDER — VITAMIN B-6 25 MG PO TABS: 25.0000 mg | ORAL_TABLET | Freq: Three times a day (TID) | ORAL | Status: DC

## 2015-10-22 NOTE — Telephone Encounter (Signed)
Patient called requesting something for nausea. She doesn't come in for her nob nurse visit until 3/2. She uses the [facility] in [place].Thanks

## 2015-10-22 NOTE — Telephone Encounter (Signed)
Ob pt 7 1/7. C/o of nausea and vomiting. Able to hold liquids down. Has had 4-5 cups of h20 today. Feels weak d/t not eating. Sent in vit b6 and unisom. Can not send in diclegis d/t medicaid. Given n/v protocol. Will contact office on Thursday if no better.

## 2015-10-23 ENCOUNTER — Telehealth: Payer: Self-pay | Admitting: Obstetrics and Gynecology

## 2015-10-23 MED ORDER — ONDANSETRON HCL 4 MG PO TABS: 4.0000 mg | ORAL_TABLET | Freq: Three times a day (TID) | ORAL | Status: DC | PRN

## 2015-10-23 NOTE — Telephone Encounter (Signed)
PT CALLED AND SHE STATED THAT MEDICAID WILL NOT COVER WHAT WAS CALLED IN FOR HER MORNING SICKNESS.

## 2015-10-23 NOTE — Telephone Encounter (Signed)
B and unisom are OTC. Medicaid will not cover it. Sent in zofran. Pt will contact me if she can NOT get rx.

## 2015-11-13 ENCOUNTER — Ambulatory Visit (INDEPENDENT_AMBULATORY_CARE_PROVIDER_SITE_OTHER): Payer: Medicaid Other | Admitting: Obstetrics and Gynecology

## 2015-11-13 ENCOUNTER — Ambulatory Visit (INDEPENDENT_AMBULATORY_CARE_PROVIDER_SITE_OTHER): Payer: Medicaid Other

## 2015-11-13 VITALS — BP 105/75 | HR 73 | Wt 172.0 lb

## 2015-11-13 DIAGNOSIS — Z1379 Encounter for other screening for genetic and chromosomal anomalies: Secondary | ICD-10-CM

## 2015-11-13 DIAGNOSIS — Z3687 Encounter for antenatal screening for uncertain dates: Secondary | ICD-10-CM

## 2015-11-13 DIAGNOSIS — Z331 Pregnant state, incidental: Secondary | ICD-10-CM

## 2015-11-13 DIAGNOSIS — Z36 Encounter for antenatal screening of mother: Secondary | ICD-10-CM

## 2015-11-13 DIAGNOSIS — R638 Other symptoms and signs concerning food and fluid intake: Secondary | ICD-10-CM | POA: Diagnosis not present

## 2015-11-13 DIAGNOSIS — Z1389 Encounter for screening for other disorder: Secondary | ICD-10-CM | POA: Diagnosis not present

## 2015-11-13 DIAGNOSIS — Z369 Encounter for antenatal screening, unspecified: Secondary | ICD-10-CM

## 2015-11-13 DIAGNOSIS — Z3490 Encounter for supervision of normal pregnancy, unspecified, unspecified trimester: Secondary | ICD-10-CM

## 2015-11-13 DIAGNOSIS — O3110X0 Continuing pregnancy after spontaneous abortion of one fetus or more, unspecified trimester, not applicable or unspecified: Secondary | ICD-10-CM

## 2015-11-13 DIAGNOSIS — Z113 Encounter for screening for infections with a predominantly sexual mode of transmission: Secondary | ICD-10-CM

## 2015-11-13 NOTE — Progress Notes (Signed)
OB Consult: Patient is a [id number] who presents s/p ultrasound performed for viability/dating secondary to unsure LMP.  Ultrasound notes mono-di gestation, however notes fetal demise of twin B at [redacted] weeks gestation.  Twin A measuring at 3880w2d, viable. 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 Pregnancy was unplanned, but is desired. Due to type of twining, would recommend repeat scan in ~ 2 weeks for viability of remaining twin (discussed genetic testing with patient as well, she desires 1st trimester screen, can be performed at the same time). Will also have NOB visit at same time. All questions answered.  NOB labs ordered today.   A total of 15 minutes were spent face-to-face with the patient during this encounter and over half of that time dealt with counseling and coordination of care.

## 2015-11-13 NOTE — Progress Notes (Signed)
Terri PiliKarena M Bell presents for NOB nurse interview visit. G-5.  P-2022. Pregnancy was confirmed at [facility] but pt forgot her confirmation. In house pregnancy test today: positive. LMP: 09/02/2015. EDD:06/08/2016.Pt unsure of lmp and will have an ultrasound for viability and dating today at 2pm.  Pt having n/v and Zofran helping some. Encourage pt to take Vitamin B6. Does not want phenergan because it makes her sleepy. Pregnancy education material explained and given. NO cats in the home. NOB labs ordered. TSH/HbgA1c due to Increased BMI, HIV labs and Drug screen were explained optional and she could opt out of tests but did not decline. Drug screen ordered. PNV encouraged. NT ordered and pt want the Spina Bifida in 2nd trimester.  Pt. To follow up with provider in 2 weeks for NOB physical.  All questions answered.    ZIKA EXPOSURE SCREEN:  The patient has not traveled to a [place] endemic area within the past 6 months, nor has she had unprotected sex with a partner who has travelled to a [place] endemic region within the past 6 months. The patient has been advised to notify us if these factors change any time during this current pregnancy, so adequate testing and monitoring can be initiated.

## 2015-11-13 NOTE — Patient Instructions (Signed)
Pregnancy and Zika Virus Disease Zika virus disease, or Zika, is an illness that can spread to people from mosquitoes that carry the virus. It may also spread from person to person through infected body fluids. Zika first occurred in [place], but recently it has spread to new areas. The virus occurs in tropical climates. The location of Zika continues to change. Most people who become infected with Zika virus do not develop serious illness. However, Zika may cause birth defects in an unborn baby whose mother is infected with the virus. It may also increase the risk of miscarriage. WHAT ARE THE SYMPTOMS OF ZIKA VIRUS DISEASE? In many cases, people who have been infected with Zika virus do not develop any symptoms. If symptoms appear, they usually start about a week after the person is infected. Symptoms are usually mild. They may include:  Fever.  Rash.  Red eyes.  Joint pain. HOW DOES ZIKA VIRUS DISEASE SPREAD? The main way that Zika virus spreads is through the bite of a certain type of mosquito. Unlike most types of mosquitos, which bite only at night, the type of mosquito that carries Zika virus bites both at night and during the day. Zika virus can also spread through sexual contact, through a blood transfusion, and from a mother to her baby before or during birth. Once you have had Zika virus disease, it is unlikely that you will get it again. CAN I PASS ZIKA TO MY BABY DURING PREGNANCY? Yes, Zika can pass from a mother to her baby before or during birth. WHAT PROBLEMS CAN ZIKA CAUSE FOR MY BABY? A woman who is infected with Zika virus while pregnant is at risk of having her baby born with a condition in which the brain or head is smaller than expected (microcephaly). Babies who have microcephaly can have developmental delays, seizures, hearing problems, and vision problems. Having Zika virus disease during pregnancy can also increase the risk of miscarriage. HOW CAN ZIKA VIRUS DISEASE BE  PREVENTED? There is no vaccine to prevent Zika. The best way to prevent the disease is to avoid infected mosquitoes and avoid exposure to body fluids that can spread the virus. Avoid any possible exposure to Zika by taking the following precautions. For women and their sex partners:  Avoid traveling to high-risk areas. The locations where Zika is being reported change often. To identify high-risk areas, check the CDC travel website: www.cdc.gov/zika/geo/index.html  If you or your sex partner must travel to a high-risk area, talk with a health care provider before and after traveling.  Take all precautions to avoid mosquito bites if you live in, or travel to, any of the high-risk areas. Insect repellents are safe to use during pregnancy.  Ask your health care provider when it is safe to have sexual contact. For women:  If you are pregnant or trying to become pregnant, avoid sexual contact with persons who may have been exposed to Zika virus, persons who have possible symptoms of Zika, or persons whose history you are unsure about. If you choose to have sexual contact with someone who may have been exposed to Zika virus, use condoms correctly during the entire duration of sexual activity, every time. Do not share sexual devices, as you may be exposed to body fluids.  Ask your health care provider about when it is safe to attempt pregnancy after a possible exposure to Zika virus. WHAT STEPS SHOULD I TAKE TO AVOID MOSQUITO BITES? Take these steps to avoid mosquito bites when you are   in a high-risk area:  Wear loose clothing that covers your arms and legs.  Limit your outdoor activities.  Do not open windows unless they have window screens.  Sleep under mosquito nets.  Use insect repellent. The best insect repellents have:  DEET, picaridin, oil of lemon eucalyptus (OLE), or IR3535 in them.  Higher amounts of an active ingredient in them.  Remember that insect repellents are safe to use  during pregnancy.  Do not use OLE on children who are younger than 3 years of age. Do not use insect repellent on babies who are younger than 2 months of age.  Cover your child's stroller with mosquito netting. Make sure the netting fits snugly and that any loose netting does not cover your child's mouth or nose. Do not use a blanket as a mosquito-protection cover.  Do not apply insect repellent underneath clothing.  If you are using sunscreen, apply the sunscreen before applying the insect repellent.  Treat clothing with permethrin. Do not apply permethrin directly to your skin. Follow label directions for safe use.  Get rid of standing water, where mosquitoes may reproduce. Standing water is often found in items such as buckets, bowls, animal food dishes, and flowerpots. When you return from traveling to any high-risk area, continue taking actions to protect yourself against mosquito bites for 3 weeks, even if you show no signs of illness. This will prevent spreading Zika virus to uninfected mosquitoes. WHAT SHOULD I KNOW ABOUT THE SEXUAL TRANSMISSION OF ZIKA? People can spread Zika to their sexual partners during vaginal, anal, or oral sex, or by sharing sexual devices. Many people with Zika do not develop symptoms, so a person could spread the disease without knowing that they are infected. The greatest risk is to women who are pregnant or who may become pregnant. Zika virus can live longer in semen than it can live in blood. Couples can prevent sexual transmission of the virus by:  Using condoms correctly during the entire duration of sexual activity, every time. This includes vaginal, anal, and oral sex.  Not sharing sexual devices. Sharing increases your risk of being exposed to body fluid from another person.  Avoiding all sexual activity until your health care provider says it is safe. SHOULD I BE TESTED FOR ZIKA VIRUS? A sample of your blood can be tested for Zika virus. A pregnant  woman should be tested if she may have been exposed to the virus or if she has symptoms of Zika. She may also have additional tests done during her pregnancy, such ultrasound testing. Talk with your health care provider about which tests are recommended.   This information is not intended to replace advice given to you by your health care provider. Make sure you discuss any questions you have with your health care provider.   Document Released: 05/09/2015 Document Reviewed: 05/02/2015 Elsevier Interactive Patient Education 2016 [employer]. Minor Illnesses and Medications in Pregnancy  Cold/Flu:  Sudafed for congestion- Robitussin (plain) for cough- Tylenol for discomfort.  Please follow the directions on the label.  Try not to take any more than needed.  OTC Saline nasal spray and air humidifier or cool-mist  Vaporizer to sooth nasal irritation and to loosen congestion.  It is also important to increase intake of non carbonated fluids, especially if you have a fever.  Constipation:  Colace-2 capsules at bedtime; Metamucil- follow directions on label; Senokot- 1 tablet at bedtime.  Any one of these medications can be used.  It is also   very important to increase fluids and fruits along with regular exercise.  If problem persists please call the office.  Diarrhea:  Kaopectate as directed on the label.  Eat a bland diet and increase fluids.  Avoid highly seasoned foods.  Headache:  Tylenol 1 or 2 tablets every 3-4 hours as needed  Indigestion:  Maalox, Mylanta, Tums or Rolaids- as directed on label.  Also try to eat small meals and avoid fatty, greasy or spicy foods.  Nausea with or without Vomiting:  Nausea in pregnancy is caused by increased levels of hormones in the body which influence the digestive system and cause irritation when stomach acids accumulate.  Symptoms usually subside after 1st trimester of pregnancy.  Try the following:  Keep saltines, graham crackers or dry toast by your bed  to eat upon awakening.  Don't let your stomach get empty.  Try to eat 5-6 small meals per day instead of 3 large ones.  Avoid greasy fatty or highly seasoned foods.   Take OTC Unisom 1 tablet at bed time along with OTC Vitamin B6 25-50 mg 3 times per day.    If nausea continues with vomiting and you are unable to keep down food and fluids you may need a prescription medication.  Please notify your provider.   Sore throat:  Chloraseptic spray, throat lozenges and or plain Tylenol.  Vaginal Yeast Infection:  OTC Monistat for 7 days as directed on label.  If symptoms do not resolve within a week notify provider.  If any of the above problems do not subside with recommended treatment please call the office for further assistance.   Do not take Aspirin, Advil, Motrin or Ibuprofen.  * * OTC= Over the counter Hyperemesis Gravidarum Hyperemesis gravidarum is a severe form of nausea and vomiting that happens during pregnancy. Hyperemesis is worse than morning sickness. It may cause you to have nausea or vomiting all day for many days. It may keep you from eating and drinking enough food and liquids. Hyperemesis usually occurs during the first half (the first 20 weeks) of pregnancy. It often goes away once a woman is in her second half of pregnancy. However, sometimes hyperemesis continues through an entire pregnancy.  CAUSES  The cause of this condition is not completely known but is thought to be related to changes in the body's hormones when pregnant. It could be from the high level of the pregnancy hormone or an increase in estrogen in the body.  SIGNS AND SYMPTOMS   Severe nausea and vomiting.  Nausea that does not go away.  Vomiting that does not allow you to keep any food down.  Weight loss and body fluid loss (dehydration).  Having no desire to eat or not liking food you have previously enjoyed. DIAGNOSIS  Your health care provider will do a physical exam and ask you about your symptoms.  He or she may also order blood tests and urine tests to make sure something else is not causing the problem.  TREATMENT  You may only need medicine to control the problem. If medicines do not control the nausea and vomiting, you will be treated in the hospital to prevent dehydration, increased acid in the blood (acidosis), weight loss, and changes in the electrolytes in your body that may harm the unborn baby (fetus). You may need IV fluids.  HOME CARE INSTRUCTIONS   Only take over-the-counter or prescription medicines as directed by your health care provider.  Try eating a couple of dry crackers or   toast in the morning before getting out of bed.  Avoid foods and smells that upset your stomach.  Avoid fatty and spicy foods.  Eat 5-6 small meals a day.  Do not drink when eating meals. Drink between meals.  For snacks, eat high-protein foods, such as cheese.  Eat or suck on things that have ginger in them. Ginger helps nausea.  Avoid food preparation. The smell of food can spoil your appetite.  Avoid iron pills and iron in your multivitamins until after 3-4 months of being pregnant. However, consult with your health care provider before stopping any prescribed iron pills. SEEK MEDICAL CARE IF:   Your abdominal pain increases.  You have a severe headache.  You have vision problems.  You are losing weight. SEEK IMMEDIATE MEDICAL CARE IF:   You are unable to keep fluids down.  You vomit blood.  You have constant nausea and vomiting.  You have excessive weakness.  You have extreme thirst.  You have dizziness or fainting.  You have a fever or persistent symptoms for more than 2-3 days.  You have a fever and your symptoms suddenly get worse. MAKE SURE YOU:   Understand these instructions.  Will watch your condition.  Will get help right away if you are not doing well or get worse.   This information is not intended to replace advice given to you by your health care  provider. Make sure you discuss any questions you have with your health care provider.   Document Released: 08/18/2005 Document Revised: 06/08/2013 Document Reviewed: 03/30/2013 Elsevier Interactive Patient Education 2016 Elsevier Inc. Commonly Asked Questions During Pregnancy  Cats: A parasite can be excreted in cat feces.  To avoid exposure you need to have another person empty the little box.  If you must empty the litter box you will need to wear gloves.  Wash your hands after handling your cat.  This parasite can also be found in raw or undercooked meat so this should also be avoided.  Colds, Sore Throats, Flu: Please check your medication sheet to see what you can take for symptoms.  If your symptoms are unrelieved by these medications please call the office.  Dental Work: Most any dental work your dentist recommends is permitted.  X-rays should only be taken during the first trimester if absolutely necessary.  Your abdomen should be shielded with a lead apron during all x-rays.  Please notify your provider prior to receiving any x-rays.  Novocaine is fine; gas is not recommended.  If your dentist requires a note from us prior to dental work please call the office and we will provide one for you.  Exercise: Exercise is an important part of staying healthy during your pregnancy.  You may continue most exercises you were accustomed to prior to pregnancy.  Later in your pregnancy you will most likely notice you have difficulty with activities requiring balance like riding a bicycle.  It is important that you listen to your body and avoid activities that put you at a higher risk of falling.  Adequate rest and staying well hydrated are a must!  If you have questions about the safety of specific activities ask your provider.    Exposure to Children with illness: Try to avoid obvious exposure; report any symptoms to us when noted,  If you have chicken pos, red measles or mumps, you should be immune to  these diseases.   Please do not take any vaccines while pregnant unless you have checked with   your OB provider.  Fetal Movement: After 28 weeks we recommend you do "kick counts" twice daily.  Lie or sit down in a calm quiet environment and count your baby movements "kicks".  You should feel your baby at least 10 times per hour.  If you have not felt 10 kicks within the first hour get up, walk around and have something sweet to eat or drink then repeat for an additional hour.  If count remains less than 10 per hour notify your provider.  Fumigating: Follow your pest control agent's advice as to how long to stay out of your home.  Ventilate the area well before re-entering.  Hemorrhoids:   Most over-the-counter preparations can be used during pregnancy.  Check your medication to see what is safe to use.  It is important to use a stool softener or fiber in your diet and to drink lots of liquids.  If hemorrhoids seem to be getting worse please call the office.   Hot Tubs:  Hot tubs Jacuzzis and saunas are not recommended while pregnant.  These increase your internal body temperature and should be avoided.  Intercourse:  Sexual intercourse is safe during pregnancy as long as you are comfortable, unless otherwise advised by your provider.  Spotting may occur after intercourse; report any bright red bleeding that is heavier than spotting.  Labor:  If you know that you are in labor, please go to the hospital.  If you are unsure, please call the office and let us help you decide what to do.  Lifting, straining, etc:  If your job requires heavy lifting or straining please check with your provider for any limitations.  Generally, you should not lift items heavier than that you can lift simply with your hands and arms (no back muscles)  Painting:  Paint fumes do not harm your pregnancy, but may make you ill and should be avoided if possible.  Latex or water based paints have less odor than oils.  Use adequate  ventilation while painting.  Permanents & Hair Color:  Chemicals in hair dyes are not recommended as they cause increase hair dryness which can increase hair loss during pregnancy.  " Highlighting" and permanents are allowed.  Dye may be absorbed differently and permanents may not hold as well during pregnancy.  Sunbathing:  Use a sunscreen, as skin burns easily during pregnancy.  Drink plenty of fluids; avoid over heating.  Tanning Beds:  Because their possible side effects are still unknown, tanning beds are not recommended.  Ultrasound Scans:  Routine ultrasounds are performed at approximately 20 weeks.  You will be able to see your baby's general anatomy an if you would like to know the gender this can usually be determined as well.  If it is questionable when you conceived you may also receive an ultrasound early in your pregnancy for dating purposes.  Otherwise ultrasound exams are not routinely performed unless there is a medical necessity.  Although you can request a scan we ask that you pay for it when conducted because insurance does not cover " patient request" scans.  Work: If your pregnancy proceeds without complications you may work until your due date, unless your physician or employer advises otherwise.  Round Ligament Pain/Pelvic Discomfort:  Sharp, shooting pains not associated with bleeding are fairly common, usually occurring in the second trimester of pregnancy.  They tend to be worse when standing up or when you remain standing for long periods of time.  These are the result   of pressure of certain pelvic ligaments called "round ligaments".  Rest, Tylenol and heat seem to be the most effective relief.  As the womb and fetus grow, they rise out of the pelvis and the discomfort improves.  Please notify the office if your pain seems different than that described.  It may represent a more serious condition.   

## 2015-11-14 ENCOUNTER — Other Ambulatory Visit: Payer: Self-pay | Admitting: Obstetrics and Gynecology

## 2015-11-14 DIAGNOSIS — Z2839 Other underimmunization status: Secondary | ICD-10-CM

## 2015-11-14 DIAGNOSIS — O9989 Other specified diseases and conditions complicating pregnancy, childbirth and the puerperium: Principal | ICD-10-CM

## 2015-11-14 DIAGNOSIS — F1290 Cannabis use, unspecified, uncomplicated: Secondary | ICD-10-CM

## 2015-11-14 DIAGNOSIS — O09899 Supervision of other high risk pregnancies, unspecified trimester: Secondary | ICD-10-CM | POA: Insufficient documentation

## 2015-11-14 DIAGNOSIS — Z283 Underimmunization status: Secondary | ICD-10-CM | POA: Insufficient documentation

## 2015-11-14 LAB — CBC WITH DIFFERENTIAL/PLATELET
Basophils Absolute: 0 10*3/uL (ref 0.0–0.2)
Basos: 0 %
EOS (ABSOLUTE): 0.3 10*3/uL (ref 0.0–0.4)
Eos: 6 %
Hematocrit: 35.1 % (ref 34.0–46.6)
Hemoglobin: 12.2 g/dL (ref 11.1–15.9)
IMMATURE GRANULOCYTES: 0 %
Immature Grans (Abs): 0 10*3/uL (ref 0.0–0.1)
LYMPHS: 33 %
Lymphocytes Absolute: 1.8 10*3/uL (ref 0.7–3.1)
MCH: 27.4 pg (ref 26.6–33.0)
MCHC: 34.8 g/dL (ref 31.5–35.7)
MCV: 79 fL (ref 79–97)
MONOS ABS: 0.3 10*3/uL (ref 0.1–0.9)
Monocytes: 6 %
NEUTROS ABS: 2.9 10*3/uL (ref 1.4–7.0)
NEUTROS PCT: 55 %
PLATELETS: 288 10*3/uL (ref 150–379)
RBC: 4.46 x10E6/uL (ref 3.77–5.28)
RDW: 14.8 % (ref 12.3–15.4)
WBC: 5.3 10*3/uL (ref 3.4–10.8)

## 2015-11-14 LAB — URINALYSIS, ROUTINE W REFLEX MICROSCOPIC
BILIRUBIN UA: NEGATIVE
GLUCOSE, UA: NEGATIVE
KETONES UA: NEGATIVE
LEUKOCYTES UA: NEGATIVE
Nitrite, UA: NEGATIVE
PROTEIN UA: NEGATIVE
RBC UA: NEGATIVE
SPEC GRAV UA: 1.017 (ref 1.005–1.030)
Urobilinogen, Ur: 0.2 mg/dL (ref 0.2–1.0)
pH, UA: 6 (ref 5.0–7.5)

## 2015-11-14 LAB — PAIN MGT SCRN (14 DRUGS), UR
AMPHETAMINE SCRN UR: NEGATIVE ng/mL
BENZODIAZEPINE SCREEN, URINE: NEGATIVE ng/mL
Barbiturate Screen, Ur: NEGATIVE ng/mL
Buprenorphine, Urine: NEGATIVE ng/mL
CANNABINOIDS UR QL SCN: POSITIVE ng/mL
COCAINE(METAB.) SCREEN, URINE: NEGATIVE ng/mL
Creatinine(Crt), U: 120.5 mg/dL (ref 20.0–300.0)
FENTANYL, URINE: NEGATIVE pg/mL
Meperidine Screen, Urine: NEGATIVE ng/mL
Methadone Scn, Ur: NEGATIVE ng/mL
Opiate Scrn, Ur: NEGATIVE ng/mL
Oxycodone+Oxymorphone Ur Ql Scn: NEGATIVE ng/mL
PCP Scrn, Ur: NEGATIVE ng/mL
Ph of Urine: 5.8 (ref 4.5–8.9)
Propoxyphene, Screen: NEGATIVE ng/mL
TRAMADOL UR QL SCN: NEGATIVE ng/mL

## 2015-11-14 LAB — HIV ANTIBODY (ROUTINE TESTING W REFLEX): HIV SCREEN 4TH GENERATION: NONREACTIVE

## 2015-11-14 LAB — NICOTINE SCREEN, URINE: Cotinine Ql Scrn, Ur: POSITIVE ng/mL

## 2015-11-14 LAB — HEPATITIS B SURFACE ANTIGEN: Hepatitis B Surface Ag: NEGATIVE

## 2015-11-14 LAB — ANTIBODY SCREEN: Antibody Screen: NEGATIVE

## 2015-11-14 LAB — HEMOGLOBIN A1C
Est. average glucose Bld gHb Est-mCnc: 103 mg/dL
Hgb A1c MFr Bld: 5.2 % (ref 4.8–5.6)

## 2015-11-14 LAB — TSH: TSH: 0.542 u[IU]/mL (ref 0.450–4.500)

## 2015-11-14 LAB — RPR: RPR Ser Ql: NONREACTIVE

## 2015-11-14 LAB — RH TYPE: Rh Factor: POSITIVE

## 2015-11-14 LAB — RUBELLA ANTIBODY, IGM

## 2015-11-14 LAB — ABO

## 2015-11-14 LAB — VARICELLA ZOSTER ANTIBODY, IGM

## 2015-11-15 ENCOUNTER — Telehealth: Payer: Self-pay | Admitting: Obstetrics and Gynecology

## 2015-11-15 ENCOUNTER — Encounter: Payer: Medicaid Other | Admitting: Obstetrics and Gynecology

## 2015-11-15 DIAGNOSIS — O3110X0 Continuing pregnancy after spontaneous abortion of one fetus or more, unspecified trimester, not applicable or unspecified: Secondary | ICD-10-CM | POA: Insufficient documentation

## 2015-11-15 LAB — URINE CULTURE, OB REFLEX

## 2015-11-15 LAB — GC/CHLAMYDIA PROBE AMP
CHLAMYDIA, DNA PROBE: NEGATIVE
NEISSERIA GONORRHOEAE BY PCR: NEGATIVE

## 2015-11-15 LAB — CULTURE, OB URINE

## 2015-11-15 NOTE — Telephone Encounter (Signed)
Pt states that she is feeling pain on the left side of the abdomen, pt states that the pain came on suddenly. Pt states that the pain has been all day today. Pt denies bleeding. Pt states that she initially thought pains where hunger related, however has eaten and pain is still there. Advised pt on plain Tylenol 325mg  and warm baths for relief. Pt gave verbal understanding. Pt to call back if no relief.

## 2015-11-15 NOTE — Telephone Encounter (Signed)
PT IS [redacted] WK PREGNANT AND IS HAVING LOWER ABDOMINAL PAIN. COMING AND GOING PAIN SCALE 5 RIGHT NOW AND EARLIER PAIN SCALE ABOUT 8

## 2015-11-22 ENCOUNTER — Telehealth: Payer: Self-pay | Admitting: Obstetrics and Gynecology

## 2015-11-22 ENCOUNTER — Encounter: Payer: Self-pay | Admitting: Obstetrics and Gynecology

## 2015-11-22 NOTE — Telephone Encounter (Signed)
I spoke with Dr. Valentino Saxonherry and pt may come in tomorrow for u/a and possible uc but at this time time will have US as scheduled for 11/27/15 for NT and one of the twins demise. Appt set tomorrow at 11:15 am. Pt states she is hurting pretty bad and may go to ER if she is not feeling any better or worse. No vaginal bleeding.

## 2015-11-22 NOTE — Telephone Encounter (Signed)
Pt is [redacted] weeks pregnant and she is experiencing a lot of pelvic pain on both sides, no spotting or bleeding.

## 2015-11-23 ENCOUNTER — Ambulatory Visit (INDEPENDENT_AMBULATORY_CARE_PROVIDER_SITE_OTHER): Payer: Medicaid Other | Admitting: Obstetrics and Gynecology

## 2015-11-23 ENCOUNTER — Other Ambulatory Visit: Payer: Self-pay | Admitting: Obstetrics and Gynecology

## 2015-11-23 VITALS — BP 107/75 | HR 79 | Temp 99.0°F | Wt 171.0 lb

## 2015-11-23 DIAGNOSIS — O9989 Other specified diseases and conditions complicating pregnancy, childbirth and the puerperium: Secondary | ICD-10-CM | POA: Diagnosis not present

## 2015-11-23 DIAGNOSIS — R102 Pelvic and perineal pain: Secondary | ICD-10-CM

## 2015-11-23 DIAGNOSIS — R110 Nausea: Secondary | ICD-10-CM

## 2015-11-23 DIAGNOSIS — O26899 Other specified pregnancy related conditions, unspecified trimester: Secondary | ICD-10-CM

## 2015-11-23 LAB — POCT URINALYSIS DIPSTICK
BILIRUBIN UA: NEGATIVE
Glucose, UA: NEGATIVE
Ketones, UA: NEGATIVE
Nitrite, UA: NEGATIVE
PH UA: 6
Protein, UA: NEGATIVE
RBC UA: NEGATIVE
SPEC GRAV UA: 1.015
Urobilinogen, UA: NEGATIVE

## 2015-11-23 MED ORDER — PROMETHAZINE HCL 25 MG PO TABS: 25.0000 mg | ORAL_TABLET | Freq: Four times a day (QID) | ORAL | Status: DC | PRN

## 2015-11-23 NOTE — Progress Notes (Signed)
Pt came in today as scheduled to have ua done due to pelvic pain. UA results: +small leuks. No spotting. Some back pain. Has headache today. Noted nasal congestion and is to take Sudafed (ask pharmacist for it) as directed and Tylenol ES as directed. Will send urine for culture. Pt to keep appt on Tuesday for ultrasound. To contact office if fever above 100.8 and note relieved by Tylenol, if vaginal bleeding or if pain is severe. Pt desires phenergan for nausea so rx sent to pharmacy.

## 2015-11-23 NOTE — Progress Notes (Signed)
I have reviewed the documentation by Fenton Mallingebbie Ridgeway, LPN and concur with patient management and plan.  Terri LaserAnika Zyanna Leisinger, MD [facility]

## 2015-11-24 LAB — URINE CULTURE

## 2015-11-27 ENCOUNTER — Other Ambulatory Visit: Payer: Self-pay | Admitting: Obstetrics and Gynecology

## 2015-11-27 ENCOUNTER — Ambulatory Visit (INDEPENDENT_AMBULATORY_CARE_PROVIDER_SITE_OTHER): Payer: Medicaid Other

## 2015-11-27 ENCOUNTER — Ambulatory Visit (INDEPENDENT_AMBULATORY_CARE_PROVIDER_SITE_OTHER): Payer: Medicaid Other | Admitting: Obstetrics and Gynecology

## 2015-11-27 VITALS — BP 104/61 | HR 78 | Wt 169.7 lb

## 2015-11-27 DIAGNOSIS — Z3492 Encounter for supervision of normal pregnancy, unspecified, second trimester: Secondary | ICD-10-CM

## 2015-11-27 DIAGNOSIS — F1290 Cannabis use, unspecified, uncomplicated: Secondary | ICD-10-CM

## 2015-11-27 DIAGNOSIS — O3110X0 Continuing pregnancy after spontaneous abortion of one fetus or more, unspecified trimester, not applicable or unspecified: Secondary | ICD-10-CM

## 2015-11-27 DIAGNOSIS — O9989 Other specified diseases and conditions complicating pregnancy, childbirth and the puerperium: Secondary | ICD-10-CM

## 2015-11-27 DIAGNOSIS — Z283 Underimmunization status: Secondary | ICD-10-CM

## 2015-11-27 DIAGNOSIS — Z1379 Encounter for other screening for genetic and chromosomal anomalies: Secondary | ICD-10-CM

## 2015-11-27 DIAGNOSIS — R896 Abnormal cytological findings in specimens from other organs, systems and tissues: Secondary | ICD-10-CM

## 2015-11-27 DIAGNOSIS — O3121X0 Continuing pregnancy after intrauterine death of one fetus or more, first trimester, not applicable or unspecified: Secondary | ICD-10-CM

## 2015-11-27 DIAGNOSIS — F1210 Cannabis abuse, uncomplicated: Secondary | ICD-10-CM

## 2015-11-27 DIAGNOSIS — IMO0002 Reserved for concepts with insufficient information to code with codable children: Secondary | ICD-10-CM

## 2015-11-27 DIAGNOSIS — Z2839 Other underimmunization status: Secondary | ICD-10-CM

## 2015-11-27 LAB — POCT URINALYSIS DIPSTICK
Bilirubin, UA: 1
Blood, UA: NEGATIVE
GLUCOSE UA: NEGATIVE
Ketones, UA: NEGATIVE
Nitrite, UA: NEGATIVE
PROTEIN UA: NEGATIVE
SPEC GRAV UA: 1.015
UROBILINOGEN UA: 0.2
pH, UA: 6.5

## 2015-11-27 NOTE — Progress Notes (Signed)
Nob pe and nt. NEW OB HISTORY AND PHYSICAL   Subjective:       Terri Bell is a 30 y.o. [phone] female is here for gynecologic evaluation of the following issues:  1. New OB history and physical  Prenatal risk factors: 1. Vanishing twin; no bleeding; round ligament type pain 2. Nausea and vomiting, managed with Phenergan 3. Sickle cell carrier; father of baby negative 4. ASCUS/positive Pap smear history   Gynecologic History Patient's last menstrual period was 09/02/2015 (approximate). Contraception: none  Obstetric History OB History  Gravida Para Term Preterm AB SAB TAB Ectopic Multiple Living  0 2    # Outcome Date GA Lbr Len/2nd Weight Sex Delivery Anes PTL Lv  5 Current           4 SAB 08/2015        FD  3 Term 01/02/15 [redacted]w[redacted]d 09:03 / 00:19 8 lb 4.3 oz (3.751 kg) M Vag-Spont None  Y     Comments: none  2 SAB 10/2006        FD  1 Gravida 07/26/06   8 lb 5 oz (3.771 kg) M Vag-Spont EPI  Y    Obstetric Comments  08/2015 pt thinks she had miscarriage, not seen by physcian.    Past Medical History  Diagnosis Date  . Medical history non-contributory   . Sickle cell trait (HCC) increased bmi  . Increased BMI   . Vaginal Pap smear, abnormal 08/02/2014    ascus/pos hpv- normal colpo; repeat 3 months pp    Past Surgical History  Procedure Laterality Date  . No past surgeries      Current Outpatient Prescriptions on File Prior to Visit  Medication Sig Dispense Refill  . Prenatal Vit-Fe Fumarate-FA (PRENATAL MULTIVITAMIN) TABS tablet Take 1 tablet by mouth daily at 12 noon.    . promethazine (PHENERGAN) 25 MG tablet Take 1 tablet (25 mg total) by mouth every 6 (six) hours as needed for nausea or vomiting. 30 tablet 1   No current facility-administered medications on file prior to visit.    Allergies  Allergen Reactions  . Amoxicillin Hives  . Penicillins Hives    Social History   Social History  . Marital Status: Single    Spouse Name: N/A   . Number of Children: N/A  . Years of Education: N/A   Occupational History  . Not on file.   Social History Main Topics  . Smoking status: Former Games developer  . Smokeless tobacco: Never Used  . Alcohol Use: No  . Drug Use: No  . Sexual Activity: Yes   Other Topics Concern  . Not on file   Social History Narrative    Family History  Problem Relation Age of Onset  . Diabetes Neg Hx   . Heart disease Neg Hx   . Breast cancer Neg Hx   . Ovarian cancer Neg Hx   . Colon cancer Neg Hx   . Thyroid disease Paternal Aunt     The following portions of the patient's history were reviewed and updated as appropriate: allergies, current medications, past family history, past medical history, past social history, past surgical history and problem list.  Review of Systems Review of Systems - General ROS: negative for - chills, fatigue, fever, hot flashes, malaise or night sweats Hematological and Lymphatic ROS: negative for - bleeding problems or swollen lymph nodes Gastrointestinal ROS: negative for - abdominal pain, blood in stools, change  in bowel habits. POSITIVE-nausea/vomiting Musculoskeletal ROS: negative for - joint pain, muscle pain or muscular weakness Genito-Urinary ROS: negative for - change in menstrual cycle, dysmenorrhea, dyspareunia, dysuria, genital discharge, genital ulcers, hematuria, incontinence, irregular/heavy menses, nocturia or pelvic pain  Objective:   BP 104/61 mmHg  Pulse 78  Wt 169 lb 11.2 oz (76.975 kg)  LMP 09/02/2015 (Approximate) CONSTITUTIONAL: Well-developed, well-nourished female in no acute distress.  HENT:  Normocephalic, atraumatic.  NECK: Normal range of motion, supple, no masses.  Normal thyroid.  SKIN: Skin is warm and dry. No rash noted. Not diaphoretic. No erythema. No pallor. NEUROLGIC: Alert and oriented to person, place, and time. PSYCHIATRIC: Normal mood and affect. Normal behavior. Normal judgment and thought  content. CARDIOVASCULAR:Regular rate and rhythm without murmur RESPIRATORY: Clear BREASTS: No masses adenopathy or nipple discharge ABDOMEN: Soft, non distended; Non tender.  No Organomegaly. PELVIC:  External Genitalia: Normal  BUS: Normal  Vagina: Normal  Cervix: Normal; parous; no cervical motion tenderness  Uterus: 12 week size, shape,consistency  Adnexa: Normal  RV: Normal external exam  Bladder: Nontender MUSCULOSKELETAL: Normal range of motion. No tenderness.  No cyanosis, clubbing, or edema.  PROCEDURE: NT screen Ultrasound demonstrates twin gestation;  Fetus A measures 16.8 mm giving an EGA of 12.4 weeks and an ultrasound EDC of 06/06/2016 and the fetal heart rate of 152 bpm; this does correspond to the LMP EDC of 06/08/2016.  Fetus B measures 13.6 mm giving an E GA of 7.4 weeks (no fetal cardiac activity is identified, consistent with previously noted exams) these findings are consistent with a vanishing twin. Nuchal thickness-1.5 mm Fetal heart rate 152 bpm ASSESSMENT: 1. Vanishing twin 2. Nuchal translucency testing completed PLAN: 1. Continue routine prenatal care 2. See the scanned document for details of nuchal translucency ultrasound (this is a corrected report)      Assessment:   1. Rubella non-immune status, antepartum (Screening 1 year ago-immune status)  2. Sickle trait (FOB negative)  3. Marijuana use  4. Supervision of normal pregnancy, second trimester - POCT urinalysis dipstick - Pap IG w/ reflex to HPV when ASC-U  5. ASCUS with positive high risk HPV     Plan:   1. Pap smear 2. Continue Phenergan for nausea 3. NT screening done today 4.New OB counseling: The patient has been given an overview regarding routine prenatal care. Recommendations regarding diet, weight gain, and exercise in pregnancy were given. Prenatal testing, optional genetic testing, and ultrasound use in pregnancy were reviewed.  Benefits of Breast Feeding were  discussed. The patient is encouraged to consider nursing her baby post partum. 5. Return in 4 weeks for regular OB appointment  Herold HarmsMartin A Defrancesco, MD  Note: This dictation was prepared with Dragon dictation along with smaller phrase technology. Any transcriptional errors that result from this process are unintentional.

## 2015-11-29 ENCOUNTER — Telehealth: Payer: Self-pay

## 2015-11-29 LAB — PAP IG W/ RFLX HPV ASCU: PAP Smear Comment: 0

## 2015-11-29 NOTE — Telephone Encounter (Signed)
-----   Message from Hildred LaserAnika Cherry, MD sent at 11/29/2015  1:53 PM EDT ----- Please inform of normal 1st trimester screen.

## 2015-11-29 NOTE — Telephone Encounter (Signed)
Called pt informed her of information below.  

## 2015-11-30 LAB — FIRST TRIMESTER SCREEN W/NT
CRL: 60.8 mm
DIA MoM: 0.8
DIA Value: 188.1 pg/mL
GEST AGE-COLLECT: 12.4 wk
Maternal Age At EDD: 28 years
NUCHAL TRANSLUCENCY MOM: 1.01
Nuchal Translucency: 1.5 mm
Number of Fetuses: 1
PAPP-A MOM: 1.33
PAPP-A Value: 1070.5 ng/mL
PDF: 0
Test Results:: NEGATIVE
Weight: 169 [lb_av]
hCG MoM: 0.6
hCG Value: 51.2 IU/mL

## 2015-12-02 ENCOUNTER — Encounter: Payer: Self-pay | Admitting: Emergency Medicine

## 2015-12-02 DIAGNOSIS — Z87891 Personal history of nicotine dependence: Secondary | ICD-10-CM | POA: Insufficient documentation

## 2015-12-02 DIAGNOSIS — F1290 Cannabis use, unspecified, uncomplicated: Secondary | ICD-10-CM | POA: Diagnosis not present

## 2015-12-02 DIAGNOSIS — B349 Viral infection, unspecified: Secondary | ICD-10-CM | POA: Diagnosis not present

## 2015-12-02 DIAGNOSIS — R509 Fever, unspecified: Secondary | ICD-10-CM | POA: Diagnosis present

## 2015-12-02 DIAGNOSIS — R112 Nausea with vomiting, unspecified: Secondary | ICD-10-CM | POA: Diagnosis not present

## 2015-12-02 LAB — COMPREHENSIVE METABOLIC PANEL
ALT: 9 U/L — ABNORMAL LOW (ref 14–54)
ANION GAP: 7 (ref 5–15)
AST: 14 U/L — ABNORMAL LOW (ref 15–41)
Albumin: 4 g/dL (ref 3.5–5.0)
Alkaline Phosphatase: 57 U/L (ref 38–126)
BUN: 6 mg/dL (ref 6–20)
CO2: 21 mmol/L — AB (ref 22–32)
Calcium: 9.2 mg/dL (ref 8.9–10.3)
Chloride: 104 mmol/L (ref 101–111)
Creatinine, Ser: 0.44 mg/dL (ref 0.44–1.00)
Glucose, Bld: 84 mg/dL (ref 65–99)
POTASSIUM: 3.1 mmol/L — AB (ref 3.5–5.1)
SODIUM: 132 mmol/L — AB (ref 135–145)
Total Bilirubin: 0.6 mg/dL (ref 0.3–1.2)
Total Protein: 7.6 g/dL (ref 6.5–8.1)

## 2015-12-02 LAB — URINALYSIS COMPLETE WITH MICROSCOPIC (ARMC ONLY)
Bilirubin Urine: NEGATIVE
Glucose, UA: NEGATIVE mg/dL
HGB URINE DIPSTICK: NEGATIVE
Nitrite: NEGATIVE
PH: 5 (ref 5.0–8.0)
PROTEIN: NEGATIVE mg/dL
Specific Gravity, Urine: 1.013 (ref 1.005–1.030)

## 2015-12-02 LAB — CBC
HEMATOCRIT: 36.7 % (ref 35.0–47.0)
HEMOGLOBIN: 12.7 g/dL (ref 12.0–16.0)
MCH: 27.3 pg (ref 26.0–34.0)
MCHC: 34.7 g/dL (ref 32.0–36.0)
MCV: 78.6 fL — AB (ref 80.0–100.0)
PLATELETS: 276 10*3/uL (ref 150–440)
RBC: 4.67 MIL/uL (ref 3.80–5.20)
RDW: 14.1 % (ref 11.5–14.5)
WBC: 10.1 10*3/uL (ref 3.6–11.0)

## 2015-12-02 LAB — POCT RAPID STREP A: Streptococcus, Group A Screen (Direct): NEGATIVE

## 2015-12-02 LAB — POCT PREGNANCY, URINE: PREG TEST UR: POSITIVE — AB

## 2015-12-02 LAB — LIPASE, BLOOD: LIPASE: 13 U/L (ref 11–51)

## 2015-12-02 LAB — RAPID INFLUENZA A&B ANTIGENS (ARMC ONLY): INFLUENZA B (ARMC): NEGATIVE

## 2015-12-02 LAB — RAPID INFLUENZA A&B ANTIGENS: Influenza A (ARMC): NEGATIVE

## 2015-12-02 NOTE — ED Notes (Signed)
Patient reports that she is having fever, vomiting, sore throat and abd pain that started this morning. Patient reports fever of 104 at home. Patient states that she is [redacted] weeks pregnant.

## 2015-12-03 ENCOUNTER — Emergency Department
Admission: EM | Admit: 2015-12-03 | Discharge: 2015-12-03 | Disposition: A | Discharge status: Home / Self Care | Payer: Medicaid Other | Attending: Emergency Medicine | Admitting: Emergency Medicine

## 2015-12-03 DIAGNOSIS — R112 Nausea with vomiting, unspecified: Secondary | ICD-10-CM

## 2015-12-03 DIAGNOSIS — B349 Viral infection, unspecified: Secondary | ICD-10-CM

## 2015-12-03 MED ORDER — ACETAMINOPHEN 325 MG PO TABS
650.0000 mg | ORAL_TABLET | Freq: Once | ORAL | Status: DC | PRN
  Filled 2015-12-03: qty 2

## 2015-12-03 MED ORDER — METOCLOPRAMIDE HCL 10 MG PO TABS: 10.0000 mg | ORAL_TABLET | Freq: Three times a day (TID) | ORAL | Status: DC | PRN

## 2015-12-03 MED ORDER — METOCLOPRAMIDE HCL 5 MG/ML IJ SOLN: 10.0000 mg | Freq: Once | INTRAMUSCULAR | Status: DC

## 2015-12-03 MED ORDER — METOCLOPRAMIDE HCL 10 MG PO TABS
10.0000 mg | ORAL_TABLET | Freq: Once | ORAL | Status: AC
  Administered 2015-12-03: 10 mg via ORAL
  Filled 2015-12-03: qty 1

## 2015-12-03 NOTE — ED Notes (Addendum)
Pt sipping on water in triage, pt complains of continued pain. Pt offered tylenol for pain. Pt accepts. Explanation of delay provided to pt who verbalizes understanding.

## 2015-12-03 NOTE — ED Provider Notes (Signed)
[facility] Emergency Department Provider Note  ____________________________________________  Time seen: Approximately 559 AM  I have reviewed the triage vital signs and the nursing notes.   HISTORY  Chief Complaint Fever; Emesis; Abdominal Pain; and Sore Throat    HPI Terri Bell is a 30 y.o. female who comes into the hospital today not feeling well. The patient reports that she has not been feeling well all day. The patient reports that she couldn't really eat or drink because she was vomiting. She reports that she had a temperature of 104. When asked again to clarify she said 100.4. The patient is [redacted] weeks pregnant. She denies any burning with urination no cough no chest pain no dizziness no diarrhea. The patient did have a mild headache for which she took Tylenol with improvement. The patient denies any headache currently. She also denies any abdominal pain. She does have some body aches all over. The patient came into the hospital to get checked out. She was concerned that she may have the flu. Her emesis is nonbloody and nonbilious.   Past Medical History  Diagnosis Date  . Medical history non-contributory   . Sickle cell trait (HCC) increased bmi  . Increased BMI   . Vaginal Pap smear, abnormal 08/02/2014    ascus/pos hpv- normal colpo; repeat 3 months pp    Patient Active Problem List   Diagnosis Date Noted  . Rubella non-immune status, antepartum 11/14/2015  . Maternal varicella, non-immune 11/14/2015  . 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 ASCUS with positive high risk HPV 02/15/2015  . SVD (spontaneous vaginal delivery) 01/03/2015    Past Surgical History  Procedure Laterality Date  . No past surgeries      Current Outpatient Rx  Name  Route  Sig  Dispense  Refill  . metoCLOPramide (REGLAN) 10 MG tablet   Oral   Take 1 tablet (10 mg total) by mouth every 8 (eight) hours as needed for nausea.   20 tablet   0   . Prenatal Vit-Fe  Fumarate-FA (PRENATAL MULTIVITAMIN) TABS tablet   Oral   Take 1 tablet by mouth daily at 12 noon.         . promethazine (PHENERGAN) 25 MG tablet   Oral   Take 1 tablet (25 mg total) by mouth every 6 (six) hours as needed for nausea or vomiting.   30 tablet   1     Allergies Amoxicillin and Penicillins  Family History  Problem Relation Age of Onset  . Diabetes Neg Hx   . Heart disease Neg Hx   . Breast cancer Neg Hx   . Ovarian cancer Neg Hx   . Colon cancer Neg Hx   . Thyroid disease Paternal Aunt     Social History Social History  Substance Use Topics  . Smoking status: Former Games developermoker  . Smokeless tobacco: Never Used  . Alcohol Use: No    Review of Systems Constitutional:  fever/chills Eyes: No visual changes. ENT: sore throat. Cardiovascular: Denies chest pain. Respiratory: Denies shortness of breath. Gastrointestinal: nausea and vomiting.  No diarrhea.  No constipation. Genitourinary: Negative for dysuria. Musculoskeletal: Body aches Skin: Negative for rash. Neurological: Headache  10-point ROS otherwise negative.  ____________________________________________   PHYSICAL EXAM:  VITAL SIGNS: ED Triage Vitals  Enc Vitals Group     BP 12/02/15 2239 114/70 mmHg     Pulse Rate 12/02/15 2239 86     Resp 12/02/15 2239 18     Temp 12/02/15  2239 99.3 F (37.4 C)     Temp Source 12/02/15 2239 Oral     SpO2 12/02/15 2239 100 %     Weight 12/02/15 2239 161 lb (73.029 kg)     Height 12/02/15 2239  (1.575 m)     Head Cir --      Peak Flow --      Pain Score 12/02/15 2240 8     Pain Loc --      Pain Edu? --      Excl. in GC? --     Constitutional: Alert and oriented. Well appearing and in no acute distress. Eyes: Conjunctivae are normal. PERRL. EOMI. Head: Atraumatic. Nose: No congestion/rhinnorhea. Mouth/Throat: Mucous membranes are moist.  Oropharynx non-erythematous. Cardiovascular: Normal rate, regular rhythm. Grossly normal heart sounds.   Good peripheral circulation. Respiratory: Normal respiratory effort.  No retractions. Lungs CTAB. Gastrointestinal: Soft and nontender. No distention. Positive bowel sounds Musculoskeletal: No lower extremity tenderness nor edema.   Neurologic:  Normal speech and language.  Skin:  Skin is warm, dry and intact.  Psychiatric: Mood and affect are normal.   ____________________________________________   LABS (all labs ordered are listed, but only abnormal results are displayed)  Labs Reviewed  COMPREHENSIVE METABOLIC PANEL - Abnormal; Notable for the following:    Sodium 132 (*)    Potassium 3.1 (*)    CO2 21 (*)    AST 14 (*)    ALT 9 (*)    All other components within normal limits  CBC - Abnormal; Notable for the following:    MCV 78.6 (*)    All other components within normal limits  URINALYSIS COMPLETEWITH MICROSCOPIC ([facility] ONLY) - Abnormal; Notable for the following:    Color, Urine YELLOW (*)    APPearance HAZY (*)    Ketones, ur 2+ (*)    Leukocytes, UA TRACE (*)    Bacteria, UA RARE (*)    Squamous Epithelial / LPF 6-30 (*)    All other components within normal limits  POCT PREGNANCY, URINE - Abnormal; Notable for the following:    Preg Test, Ur POSITIVE (*)    All other components within normal limits  RAPID INFLUENZA A&B ANTIGENS ([facility] ONLY)  LIPASE, BLOOD  POCT RAPID STREP A   ____________________________________________  EKG  None ____________________________________________  RADIOLOGY  None ____________________________________________   PROCEDURES  Procedure(s) performed: None  Critical Care performed: No  ____________________________________________   INITIAL IMPRESSION / ASSESSMENT AND PLAN / ED COURSE  Pertinent labs & imaging results that were available during my care of the patient were reviewed by me and considered in my medical decision making (see chart for details).  This is a 30 year old female who is [redacted] weeks pregnant who comes  into the hospital not feeling well. The patient had a low-grade temperature home as well as some body aches and vomiting. The patient's blood work here shows some mild electrolyte abnormality but nothing severe. She does not have a urinary tract infection or that she have an elevated white blood cell count. The patient's flu is negative and her strep throat is negative. The patient did receive a dose of Tylenol as well as a dose of Reglan. She was able to drink without vomiting here in the emergency department. She'll be discharged home to follow-up with her OB/GYN. The patient is not having any abdominal pain and reports that her other pains are improved. She'll be discharged home to follow-up. ____________________________________________   FINAL CLINICAL IMPRESSION(S) / ED  DIAGNOSES  Final diagnoses:  Non-intractable vomiting with nausea, vomiting of unspecified type  Viral illness      Rebecka Apley, MD 12/03/15 [phone]

## 2015-12-03 NOTE — ED Notes (Signed)
E-signature box not working. Pt verbalized understanding of discharge instructions and denied questions. 

## 2015-12-03 NOTE — Discharge Instructions (Signed)

## 2015-12-03 NOTE — ED Notes (Signed)
Pt given medication for nausea and PO fluids; encouraged to drink; understands if she can tolerate same MD will discharge her home

## 2015-12-18 ENCOUNTER — Ambulatory Visit (INDEPENDENT_AMBULATORY_CARE_PROVIDER_SITE_OTHER): Payer: Medicaid Other | Admitting: Obstetrics and Gynecology

## 2015-12-18 VITALS — BP 92/56 | HR 87 | Wt 172.0 lb

## 2015-12-18 DIAGNOSIS — O3110X0 Continuing pregnancy after spontaneous abortion of one fetus or more, unspecified trimester, not applicable or unspecified: Secondary | ICD-10-CM

## 2015-12-18 DIAGNOSIS — O200 Threatened abortion: Secondary | ICD-10-CM

## 2015-12-18 DIAGNOSIS — O3121X0 Continuing pregnancy after intrauterine death of one fetus or more, first trimester, not applicable or unspecified: Secondary | ICD-10-CM

## 2015-12-18 LAB — POCT URINALYSIS DIPSTICK
BILIRUBIN UA: NEGATIVE
Blood, UA: NEGATIVE
GLUCOSE UA: NEGATIVE
Ketones, UA: NEGATIVE
Leukocytes, UA: NEGATIVE
NITRITE UA: NEGATIVE
Protein, UA: NEGATIVE
Spec Grav, UA: 1.01
Urobilinogen, UA: NEGATIVE
pH, UA: 7

## 2015-12-18 NOTE — Progress Notes (Signed)
   OBSTETRIC PROBLEM VISIT CLINIC NOTE  Subjective:   Terri Bell is a 30 y.o. [id number] 1736w2d being seen today for her obstetrical visit.  Patient reports bleeding and cramping yesterday, with passage of small clots.  Bleeding was described as bright red.  Denies any further bleeding today. Reports good fetal movement.  The following portions of the patient's history were reviewed and updated as appropriate: allergies, current medications, past family history, past medical history, past social history, past surgical history and problem list.   Objective:  BP 92/56 mmHg  Pulse 87  Wt 172 lb (78.019 kg)  LMP 09/02/2015 (Approximate)  FHT:  149  Uterine Size:  16  Fetal Movement:  Present (flutters)   Abdomen:  soft, gravid, appropriate for gestational age,non-tender  Vaginal:  Scant dark blood in vaginal vault, no lesions, no discharge  Cervix: Closed, thick     Labs:  Lab Results  Component Value Date   WBC 10.1 12/02/2015   HGB 12.7 12/02/2015   HCT 36.7 12/02/2015   MCV 78.6* 12/02/2015   PLT 276 12/02/2015   Blood type: B+/-    Assessment and Plan:   Pregnancy:  [id number] at [redacted]w[redacted]d, with vanishing twin syndrome  1. Threatened abortion, second trimester  - POCT urinalysis dipstick - Given bleeding precautions - Advised on pelvic rest for next 2-3 weeks  Follow up in 2 weeks for routine OB visit, or sooner if symptoms persist or worsen. Hildred Laser.  Anam Bobby, MD

## 2015-12-25 ENCOUNTER — Other Ambulatory Visit: Payer: Self-pay | Admitting: Obstetrics and Gynecology

## 2015-12-25 ENCOUNTER — Ambulatory Visit (INDEPENDENT_AMBULATORY_CARE_PROVIDER_SITE_OTHER): Payer: Medicaid Other | Admitting: Obstetrics and Gynecology

## 2015-12-25 VITALS — BP 111/66 | HR 85 | Wt 173.4 lb

## 2015-12-25 DIAGNOSIS — O3121X0 Continuing pregnancy after intrauterine death of one fetus or more, first trimester, not applicable or unspecified: Secondary | ICD-10-CM

## 2015-12-25 DIAGNOSIS — Z3482 Encounter for supervision of other normal pregnancy, second trimester: Secondary | ICD-10-CM

## 2015-12-25 DIAGNOSIS — Z3492 Encounter for supervision of normal pregnancy, unspecified, second trimester: Secondary | ICD-10-CM

## 2015-12-25 DIAGNOSIS — Z1379 Encounter for other screening for genetic and chromosomal anomalies: Secondary | ICD-10-CM

## 2015-12-25 DIAGNOSIS — O3110X0 Continuing pregnancy after spontaneous abortion of one fetus or more, unspecified trimester, not applicable or unspecified: Secondary | ICD-10-CM

## 2015-12-25 LAB — POCT URINALYSIS DIPSTICK
Bilirubin, UA: NEGATIVE
Blood, UA: NEGATIVE
Glucose, UA: NEGATIVE
Ketones, UA: NEGATIVE
LEUKOCYTES UA: NEGATIVE
Nitrite, UA: NEGATIVE
PROTEIN UA: NEGATIVE
SPEC GRAV UA: 1.01
UROBILINOGEN UA: NEGATIVE
pH, UA: 6

## 2015-12-26 NOTE — Progress Notes (Signed)
ROB: Patient doing well, denies any further episodes of bleeding.  For msAFP today.  RTC in 4 weeks.  For anatomy scan at that time.

## 2015-12-28 LAB — AFP, SERUM, OPEN SPINA BIFIDA
AFP MoM: 1.64
AFP Value: 55.4 ng/mL
GEST. AGE ON COLLECTION DATE: 16.2 wk
Maternal Age At EDD: 27.9 years
OSBR Risk 1 IN: 3791
PDF: 0
TEST RESULTS AFP: NEGATIVE
Weight: 173 [lb_av]

## 2016-01-01 ENCOUNTER — Telehealth: Payer: Self-pay

## 2016-01-01 NOTE — Telephone Encounter (Signed)
Called pt informed her of negative screen.

## 2016-01-01 NOTE — Telephone Encounter (Signed)
-----   Message from Hildred LaserAnika Cherry, MD sent at 01/01/2016  8:03 AM EDT ----- Please inform patient of normal serum spina bifida screen.

## 2016-01-29 ENCOUNTER — Ambulatory Visit (INDEPENDENT_AMBULATORY_CARE_PROVIDER_SITE_OTHER): Payer: Medicaid Other

## 2016-01-29 ENCOUNTER — Ambulatory Visit (INDEPENDENT_AMBULATORY_CARE_PROVIDER_SITE_OTHER): Payer: Medicaid Other | Admitting: Obstetrics and Gynecology

## 2016-01-29 ENCOUNTER — Encounter: Payer: Self-pay | Admitting: Obstetrics and Gynecology

## 2016-01-29 VITALS — BP 103/68 | HR 78 | Wt 179.1 lb

## 2016-01-29 DIAGNOSIS — O2610 Low weight gain in pregnancy, unspecified trimester: Secondary | ICD-10-CM | POA: Insufficient documentation

## 2016-01-29 DIAGNOSIS — G44029 Chronic cluster headache, not intractable: Secondary | ICD-10-CM

## 2016-01-29 DIAGNOSIS — Z3492 Encounter for supervision of normal pregnancy, unspecified, second trimester: Secondary | ICD-10-CM

## 2016-01-29 DIAGNOSIS — Z3482 Encounter for supervision of other normal pregnancy, second trimester: Secondary | ICD-10-CM | POA: Diagnosis not present

## 2016-01-29 DIAGNOSIS — O2612 Low weight gain in pregnancy, second trimester: Secondary | ICD-10-CM

## 2016-01-29 LAB — POCT URINALYSIS DIPSTICK
Bilirubin, UA: NEGATIVE
Glucose, UA: NEGATIVE
KETONES UA: NEGATIVE
Nitrite, UA: NEGATIVE
PH UA: 7.5
PROTEIN UA: NEGATIVE
RBC UA: NEGATIVE
SPEC GRAV UA: 1.01
UROBILINOGEN UA: NEGATIVE

## 2016-01-29 MED ORDER — BUTALBITAL-APAP-CAFFEINE 50-325-40 MG PO CAPS: 1.0000 | ORAL_CAPSULE | Freq: Four times a day (QID) | ORAL | Status: DC | PRN

## 2016-01-29 MED ORDER — ENSURE HEALTHY MOM PO LIQD: 1.0000 | Freq: Every day | ORAL | Status: DC

## 2016-01-29 NOTE — Progress Notes (Signed)
ROB: Patient doing well, but complains of daily headaches/migraines not relieve by Tylenol.  Will prescribe Fiorcet. S/p normal anatomy scan.  Poor weight gain of pregnancy, prescribed Boost daily. RTC in 4 weeks.

## 2016-02-27 ENCOUNTER — Encounter: Payer: Medicaid Other | Admitting: Obstetrics and Gynecology

## 2016-03-05 ENCOUNTER — Ambulatory Visit (INDEPENDENT_AMBULATORY_CARE_PROVIDER_SITE_OTHER): Payer: Medicaid Other | Admitting: Obstetrics and Gynecology

## 2016-03-05 ENCOUNTER — Other Ambulatory Visit: Payer: Medicaid Other

## 2016-03-05 ENCOUNTER — Encounter: Payer: Self-pay | Admitting: Obstetrics and Gynecology

## 2016-03-05 VITALS — BP 99/65 | HR 77 | Wt 182.2 lb

## 2016-03-05 DIAGNOSIS — Z23 Encounter for immunization: Secondary | ICD-10-CM | POA: Diagnosis not present

## 2016-03-05 DIAGNOSIS — Z36 Encounter for antenatal screening of mother: Secondary | ICD-10-CM

## 2016-03-05 DIAGNOSIS — Z3402 Encounter for supervision of normal first pregnancy, second trimester: Secondary | ICD-10-CM

## 2016-03-05 DIAGNOSIS — Z369 Encounter for antenatal screening, unspecified: Secondary | ICD-10-CM

## 2016-03-05 DIAGNOSIS — Z131 Encounter for screening for diabetes mellitus: Secondary | ICD-10-CM

## 2016-03-05 DIAGNOSIS — Z1389 Encounter for screening for other disorder: Secondary | ICD-10-CM

## 2016-03-05 LAB — POCT URINALYSIS DIPSTICK
Bilirubin, UA: NEGATIVE
Blood, UA: NEGATIVE
GLUCOSE UA: NEGATIVE
Ketones, UA: NEGATIVE
LEUKOCYTES UA: NEGATIVE
Nitrite, UA: NEGATIVE
PROTEIN UA: NEGATIVE
SPEC GRAV UA: 1.025
UROBILINOGEN UA: NEGATIVE
pH, UA: 6

## 2016-03-05 MED ORDER — TETANUS-DIPHTH-ACELL PERTUSSIS 5-2.5-18.5 LF-MCG/0.5 IM SUSP
0.5000 mL | Freq: Once | INTRAMUSCULAR | Status: AC
  Administered 2016-03-05: 0.5 mL via INTRAMUSCULAR

## 2016-03-05 NOTE — Patient Instructions (Signed)
Tdap Vaccine (Tetanus, Diphtheria and Pertussis): What You Need to Know 1. Why get vaccinated? Tetanus, diphtheria and pertussis are very serious diseases. Tdap vaccine can protect us from these diseases. And, Tdap vaccine given to pregnant women can protect newborn babies against pertussis. TETANUS (Lockjaw) is rare in the [place] today. It causes painful muscle tightening and stiffness, usually all over the body.  It can lead to tightening of muscles in the head and neck so you can't open your mouth, swallow, or sometimes even breathe. Tetanus kills about 1 out of 10 people who are infected even after receiving the best medical care. DIPHTHERIA is also rare in the [place] today. It can cause a thick coating to form in the back of the throat.  It can lead to breathing problems, heart failure, paralysis, and death. PERTUSSIS (Whooping Cough) causes severe coughing spells, which can cause difficulty breathing, vomiting and disturbed sleep.  It can also lead to weight loss, incontinence, and rib fractures. Up to 2 in 100 adolescents and 5 in 100 adults with pertussis are hospitalized or have complications, which could include pneumonia or death. These diseases are caused by bacteria. Diphtheria and pertussis are spread from person to person through secretions from coughing or sneezing. Tetanus enters the body through cuts, scratches, or wounds. Before vaccines, as many as 200,000 cases of diphtheria, 200,000 cases of pertussis, and hundreds of cases of tetanus, were reported in the [place] each year. Since vaccination began, reports of cases for tetanus and diphtheria have dropped by about 99% and for pertussis by about 80%. 2. Tdap vaccine Tdap vaccine can protect adolescents and adults from tetanus, diphtheria, and pertussis. One dose of Tdap is routinely given at age 11 or 12. People who did not get Tdap at that age should get it as soon as possible. Tdap is especially important  for healthcare professionals and anyone having close contact with a baby younger than 12 months. Pregnant women should get a dose of Tdap during every pregnancy, to protect the newborn from pertussis. Infants are most at risk for severe, life-threatening complications from pertussis. Another vaccine, called Td, protects against tetanus and diphtheria, but not pertussis. A Td booster should be given every 10 years. Tdap may be given as one of these boosters if you have never gotten Tdap before. Tdap may also be given after a severe cut or burn to prevent tetanus infection. Your doctor or the person giving you the vaccine can give you more information. Tdap may safely be given at the same time as other vaccines. 3. Some people should not get this vaccine  A person who has ever had a life-threatening allergic reaction after a previous dose of any diphtheria, tetanus or pertussis containing vaccine, OR has a severe allergy to any part of this vaccine, should not get Tdap vaccine. Tell the person giving the vaccine about any severe allergies.  Anyone who had coma or long repeated seizures within 7 days after a childhood dose of DTP or DTaP, or a previous dose of Tdap, should not get Tdap, unless a cause other than the vaccine was found. They can still get Td.  Talk to your doctor if you:  have seizures or another nervous system problem,  had severe pain or swelling after any vaccine containing diphtheria, tetanus or pertussis,  ever had a condition called Guillain-Barr Syndrome (GBS),  aren't feeling well on the day the shot is scheduled. 4. Risks With any medicine, including vaccines, there is   a chance of side effects. These are usually mild and go away on their own. Serious reactions are also possible but are rare. Most people who get Tdap vaccine do not have any problems with it. Mild problems following Tdap (Did not interfere with activities)  Pain where the shot was given (about 3 in 4  adolescents or 2 in 3 adults)  Redness or swelling where the shot was given (about 1 person in 5)  Mild fever of at least 100.4F (up to about 1 in 25 adolescents or 1 in 100 adults)  Headache (about 3 or 4 people in 10)  Tiredness (about 1 person in 3 or 4)  Nausea, vomiting, diarrhea, stomach ache (up to 1 in 4 adolescents or 1 in 10 adults)  Chills, sore joints (about 1 person in 10)  Body aches (about 1 person in 3 or 4)  Rash, swollen glands (uncommon) Moderate problems following Tdap (Interfered with activities, but did not require medical attention)  Pain where the shot was given (up to 1 in 5 or 6)  Redness or swelling where the shot was given (up to about 1 in 16 adolescents or 1 in 12 adults)  Fever over 102F (about 1 in 100 adolescents or 1 in 250 adults)  Headache (about 1 in 7 adolescents or 1 in 10 adults)  Nausea, vomiting, diarrhea, stomach ache (up to 1 or 3 people in 100)  Swelling of the entire arm where the shot was given (up to about 1 in 500). Severe problems following Tdap (Unable to perform usual activities; required medical attention)  Swelling, severe pain, bleeding and redness in the arm where the shot was given (rare). Problems that could happen after any vaccine:  People sometimes faint after a medical procedure, including vaccination. Sitting or lying down for about 15 minutes can help prevent fainting, and injuries caused by a fall. Tell your doctor if you feel dizzy, or have vision changes or ringing in the ears.  Some people get severe pain in the shoulder and have difficulty moving the arm where a shot was given. This happens very rarely.  Any medication can cause a severe allergic reaction. Such reactions from a vaccine are very rare, estimated at fewer than 1 in a million doses, and would happen within a few minutes to a few hours after the vaccination. As with any medicine, there is a very remote chance of a vaccine causing a serious  injury or death. The safety of vaccines is always being monitored. For more information, visit: www.cdc.gov/vaccinesafety/ 5. What if there is a serious problem? What should I look for?  Look for anything that concerns you, such as signs of a severe allergic reaction, very high fever, or unusual behavior.  Signs of a severe allergic reaction can include hives, swelling of the face and throat, difficulty breathing, a fast heartbeat, dizziness, and weakness. These would usually start a few minutes to a few hours after the vaccination. What should I do?  If you think it is a severe allergic reaction or other emergency that can't wait, call 9-1-1 or get the person to the nearest hospital. Otherwise, call your doctor.  Afterward, the reaction should be reported to the Vaccine Adverse Event Reporting System (VAERS). Your doctor might file this report, or you can do it yourself through the VAERS web site at www.vaers.hhs.gov, or by calling 1-800-822-7967. VAERS does not give medical advice.  6. The National Vaccine Injury Compensation Program The National Vaccine Injury Compensation Program (  VICP) is a federal program that was created to compensate people who may have been injured by certain vaccines. Persons who believe they may have been injured by a vaccine can learn about the program and about filing a claim by calling 1-800-338-2382 or visiting the VICP website at www.hrsa.gov/vaccinecompensation. There is a time limit to file a claim for compensation. 7. How can I learn more?  Ask your doctor. He or she can give you the vaccine package insert or suggest other sources of information.  Call your local or state health department.  Contact the Centers for Disease Control and Prevention (CDC):  Call 1-800-232-4636 (1-800-CDC-INFO) or  Visit CDC's website at www.cdc.gov/vaccines CDC Tdap Vaccine VIS (10/25/13)   This information is not intended to replace advice given to you by your health care  provider. Make sure you discuss any questions you have with your health care provider.   Document Released: 02/17/2012 Document Revised: 09/08/2014 Document Reviewed: 11/30/2013 Elsevier Interactive Patient Education 2016 [employer]  

## 2016-03-05 NOTE — Progress Notes (Signed)
ROB: Patient complains of anal pain several weeks ago, noted when picking up son, has since resolved.  Desires to breastfeed, unsure regarding contraception, considering patch.  Signed blood consent. Discussed cord blood banking. Tdap given.  For glucola today. RTC in 4 weeks.

## 2016-03-06 LAB — GLUCOSE, 1 HOUR GESTATIONAL: GESTATIONAL DIABETES SCREEN: 91 mg/dL (ref 65–139)

## 2016-03-11 ENCOUNTER — Telehealth: Payer: Self-pay

## 2016-03-11 NOTE — Telephone Encounter (Signed)
Pt called she states that she has been having severe pelvic pressure for the past few days, pt states that at the worst point the pressure was so painful that it caused her to vomit. Pt states that she has been trying to stay hydrated and has also taken warm baths with some short term relief. Advised pt on lying on left side, staying off feet when possible, the use of a belly band, and the use of extra strength tylenol. Advised pt to call back if sx do not improve with the above mentioned measures. Advised on PTL precautions. Pt gave verbal understanding.

## 2016-04-02 ENCOUNTER — Encounter: Payer: Medicaid Other | Admitting: Obstetrics and Gynecology

## 2016-04-03 ENCOUNTER — Ambulatory Visit (INDEPENDENT_AMBULATORY_CARE_PROVIDER_SITE_OTHER): Payer: Medicaid Other | Admitting: Obstetrics and Gynecology

## 2016-04-03 ENCOUNTER — Encounter: Payer: Self-pay | Admitting: Obstetrics and Gynecology

## 2016-04-03 VITALS — BP 105/63 | HR 94 | Wt 181.6 lb

## 2016-04-03 DIAGNOSIS — Z20828 Contact with and (suspected) exposure to other viral communicable diseases: Secondary | ICD-10-CM

## 2016-04-03 DIAGNOSIS — Z3483 Encounter for supervision of other normal pregnancy, third trimester: Secondary | ICD-10-CM

## 2016-04-03 DIAGNOSIS — Z3480 Encounter for supervision of other normal pregnancy, unspecified trimester: Secondary | ICD-10-CM

## 2016-04-03 DIAGNOSIS — O2613 Low weight gain in pregnancy, third trimester: Secondary | ICD-10-CM

## 2016-04-03 LAB — POCT URINALYSIS DIPSTICK
BILIRUBIN UA: NEGATIVE
Blood, UA: NEGATIVE
Glucose, UA: NEGATIVE
KETONES UA: NEGATIVE
Leukocytes, UA: NEGATIVE
Nitrite, UA: NEGATIVE
PH UA: 7.5
Protein, UA: NEGATIVE
Spec Grav, UA: 1.01
Urobilinogen, UA: 0.2

## 2016-04-03 NOTE — Progress Notes (Signed)
ROB: Patient denies complaints.  Normal glucola.  Reports her child had hand-foot-mouth disease several weeks ago. Patient currently denies symptoms. Will order Parvovirus titers today.  Still with poor weight gain of pregnancy.  Notes Ensure causes GI upset.  Will switch to Boost. RTC in 2 weeks.

## 2016-04-04 LAB — PARVOVIRUS B19 ANTIBODY, IGG AND IGM
PARVOVIRUS B19 IGG: 1.4 {index} — AB (ref 0.0–0.8)
PARVOVIRUS B19 IGM: 0.3 {index} (ref 0.0–0.8)

## 2016-04-10 ENCOUNTER — Telehealth: Payer: Self-pay | Admitting: Obstetrics and Gynecology

## 2016-04-10 NOTE — Telephone Encounter (Signed)
Called pt inform her that per Chandra BatchJanice Putnam she [facility]. Advised pt to try to purchase Boost with her Food stamp benefits. Pt gave verbal understanding.

## 2016-04-10 NOTE — Telephone Encounter (Signed)
[facility]Thanks

## 2016-04-16 ENCOUNTER — Telehealth: Payer: Self-pay | Admitting: Obstetrics and Gynecology

## 2016-04-16 NOTE — Telephone Encounter (Signed)
I do not see anything in this pt's chart that points to her being on bedrest, do you know anything about her being placed on bedrest?

## 2016-04-16 NOTE — Telephone Encounter (Signed)
Patient needs note stating she is on bedrest. She needs it for her housing. She can pick up the letter when ready.Thanks

## 2016-04-16 NOTE — Telephone Encounter (Signed)
I don't recall anything that she mentioned to me regarding being on bedrest.  What was her reasoning? If anything maybe she mentioned job restrictions/light duty to me last visit and was supposed to let me know if a letter was needed, but I don't know of any medical reasons that was requiring bed rest.

## 2016-04-17 NOTE — Telephone Encounter (Signed)
Called pt advised her that she was never placed on bedrest, and therefore cannot provide a letter stating such. Pt gave verbal understanding.

## 2016-04-21 ENCOUNTER — Inpatient Hospital Stay
Admission: EM | Admit: 2016-04-21 | Discharge: 2016-04-21 | Disposition: A | Discharge status: Home / Self Care | Payer: Medicaid Other | Attending: Obstetrics and Gynecology | Admitting: Obstetrics and Gynecology

## 2016-04-21 DIAGNOSIS — O26893 Other specified pregnancy related conditions, third trimester: Secondary | ICD-10-CM | POA: Insufficient documentation

## 2016-04-21 DIAGNOSIS — R102 Pelvic and perineal pain: Secondary | ICD-10-CM | POA: Insufficient documentation

## 2016-04-21 DIAGNOSIS — Z3A33 33 weeks gestation of pregnancy: Secondary | ICD-10-CM | POA: Diagnosis not present

## 2016-04-21 NOTE — OB Triage Note (Signed)
Patient presents with back and pelvic pain/pressure.  Compllaint has been ongoing.  Multiparous, no c/o vaginal bleeding/spotting.  Denies intercourse.  Patient appears in no apparent distress.  Abdomen soft, efm and toco applied.

## 2016-04-24 ENCOUNTER — Ambulatory Visit (INDEPENDENT_AMBULATORY_CARE_PROVIDER_SITE_OTHER): Payer: Medicaid Other | Admitting: Obstetrics and Gynecology

## 2016-04-24 VITALS — BP 95/60 | HR 76 | Wt 187.2 lb

## 2016-04-24 DIAGNOSIS — O2613 Low weight gain in pregnancy, third trimester: Secondary | ICD-10-CM

## 2016-04-24 DIAGNOSIS — Z3483 Encounter for supervision of other normal pregnancy, third trimester: Secondary | ICD-10-CM

## 2016-04-24 NOTE — Progress Notes (Signed)
ROB: Notes pelvic pressure. Is wearing belly band. Parvovirus IgM titers negative, IgG positive from last visit. Has increased weight gain with Boost supplements. RTC in 2 weeks.

## 2016-05-08 ENCOUNTER — Ambulatory Visit (INDEPENDENT_AMBULATORY_CARE_PROVIDER_SITE_OTHER): Payer: Medicaid Other | Admitting: Obstetrics and Gynecology

## 2016-05-08 VITALS — BP 96/63 | HR 77 | Wt 183.7 lb

## 2016-05-08 DIAGNOSIS — Z3493 Encounter for supervision of normal pregnancy, unspecified, third trimester: Secondary | ICD-10-CM

## 2016-05-08 DIAGNOSIS — Z113 Encounter for screening for infections with a predominantly sexual mode of transmission: Secondary | ICD-10-CM

## 2016-05-08 DIAGNOSIS — Z36 Encounter for antenatal screening of mother: Secondary | ICD-10-CM

## 2016-05-08 DIAGNOSIS — Z3685 Encounter for antenatal screening for Streptococcus B: Secondary | ICD-10-CM

## 2016-05-08 DIAGNOSIS — Z3483 Encounter for supervision of other normal pregnancy, third trimester: Secondary | ICD-10-CM

## 2016-05-08 LAB — POCT URINALYSIS DIPSTICK
Bilirubin, UA: NEGATIVE
Blood, UA: NEGATIVE
GLUCOSE UA: NEGATIVE
Ketones, UA: NEGATIVE
NITRITE UA: NEGATIVE
PROTEIN UA: NEGATIVE
Spec Grav, UA: 1.02
UROBILINOGEN UA: NEGATIVE
pH, UA: 6

## 2016-05-08 LAB — OB RESULTS CONSOLE GBS: GBS: NEGATIVE

## 2016-05-08 NOTE — Progress Notes (Signed)
ROB: Braxton Hicks contractions noted. Given PTL precautions. 36 week labs done. Notes issues with housing, will discuss with Social Work.  RTC in 1 week.

## 2016-05-10 LAB — GC/CHLAMYDIA PROBE AMP
Chlamydia trachomatis, NAA: NEGATIVE
Neisseria gonorrhoeae by PCR: NEGATIVE

## 2016-05-10 LAB — STREP GP B NAA+RFLX: STREP GP B NAA+RFLX: NEGATIVE

## 2016-05-15 ENCOUNTER — Ambulatory Visit (INDEPENDENT_AMBULATORY_CARE_PROVIDER_SITE_OTHER): Payer: Medicaid Other | Admitting: Obstetrics and Gynecology

## 2016-05-15 ENCOUNTER — Encounter: Payer: Self-pay | Admitting: Obstetrics and Gynecology

## 2016-05-15 VITALS — BP 108/62 | HR 88 | Wt 188.2 lb

## 2016-05-15 DIAGNOSIS — Z3483 Encounter for supervision of other normal pregnancy, third trimester: Secondary | ICD-10-CM

## 2016-05-15 LAB — POCT URINALYSIS DIPSTICK
Bilirubin, UA: NEGATIVE
Blood, UA: NEGATIVE
Glucose, UA: NEGATIVE
KETONES UA: NEGATIVE
Nitrite, UA: NEGATIVE
PH UA: 7
PROTEIN UA: NEGATIVE
Spec Grav, UA: 1.01
UROBILINOGEN UA: 0.2

## 2016-05-15 NOTE — Progress Notes (Signed)
ROB- no complaints. 36 week cultures all neg. Herold HarmsMartin A Nohealani Medinger, MD

## 2016-05-22 ENCOUNTER — Ambulatory Visit (INDEPENDENT_AMBULATORY_CARE_PROVIDER_SITE_OTHER): Payer: Medicaid Other | Admitting: Obstetrics and Gynecology

## 2016-05-22 VITALS — BP 92/57 | HR 81 | Wt 189.6 lb

## 2016-05-22 DIAGNOSIS — Z3483 Encounter for supervision of other normal pregnancy, third trimester: Secondary | ICD-10-CM

## 2016-05-22 DIAGNOSIS — Z3493 Encounter for supervision of normal pregnancy, unspecified, third trimester: Secondary | ICD-10-CM

## 2016-05-22 LAB — POCT URINALYSIS DIPSTICK
BILIRUBIN UA: NEGATIVE
Glucose, UA: NEGATIVE
Ketones, UA: NEGATIVE
NITRITE UA: NEGATIVE
PH UA: 6
Protein, UA: NEGATIVE
Spec Grav, UA: 1.02
Urobilinogen, UA: NEGATIVE

## 2016-05-22 NOTE — Progress Notes (Signed)
ROB: Doing well.  Complains of increased pressure and possible LOF. Nitrazine negative. Cervix essentially unchanged from prior visit.  Labor precautions given.  RTC in 1 week.

## 2016-05-24 ENCOUNTER — Inpatient Hospital Stay
Admission: EM | Admit: 2016-05-24 | Discharge: 2016-05-26 | DRG: 775 | Disposition: A | Discharge status: Home / Self Care | Payer: Medicaid Other | Attending: Obstetrics and Gynecology | Admitting: Obstetrics and Gynecology

## 2016-05-24 DIAGNOSIS — Z3A37 37 weeks gestation of pregnancy: Secondary | ICD-10-CM

## 2016-05-24 DIAGNOSIS — O9989 Other specified diseases and conditions complicating pregnancy, childbirth and the puerperium: Secondary | ICD-10-CM

## 2016-05-24 DIAGNOSIS — Z87891 Personal history of nicotine dependence: Secondary | ICD-10-CM | POA: Diagnosis not present

## 2016-05-24 DIAGNOSIS — Z2839 Other underimmunization status: Secondary | ICD-10-CM

## 2016-05-24 DIAGNOSIS — Z3493 Encounter for supervision of normal pregnancy, unspecified, third trimester: Secondary | ICD-10-CM | POA: Diagnosis not present

## 2016-05-24 DIAGNOSIS — Z3490 Encounter for supervision of normal pregnancy, unspecified, unspecified trimester: Secondary | ICD-10-CM

## 2016-05-24 DIAGNOSIS — Z79899 Other long term (current) drug therapy: Secondary | ICD-10-CM | POA: Diagnosis not present

## 2016-05-24 DIAGNOSIS — O9902 Anemia complicating childbirth: Secondary | ICD-10-CM | POA: Diagnosis present

## 2016-05-24 DIAGNOSIS — F1290 Cannabis use, unspecified, uncomplicated: Secondary | ICD-10-CM

## 2016-05-24 DIAGNOSIS — Z283 Underimmunization status: Secondary | ICD-10-CM

## 2016-05-24 DIAGNOSIS — D573 Sickle-cell trait: Secondary | ICD-10-CM | POA: Diagnosis present

## 2016-05-24 DIAGNOSIS — IMO0001 Reserved for inherently not codable concepts without codable children: Secondary | ICD-10-CM

## 2016-05-24 DIAGNOSIS — O09899 Supervision of other high risk pregnancies, unspecified trimester: Secondary | ICD-10-CM

## 2016-05-24 LAB — URINE DRUG SCREEN, QUALITATIVE (ARMC ONLY)
AMPHETAMINES, UR SCREEN: NOT DETECTED
BENZODIAZEPINE, UR SCRN: NOT DETECTED
Barbiturates, Ur Screen: NOT DETECTED
CANNABINOID 50 NG, UR ~~LOC~~: NOT DETECTED
Cocaine Metabolite,Ur ~~LOC~~: NOT DETECTED
MDMA (Ecstasy)Ur Screen: NOT DETECTED
Methadone Scn, Ur: NOT DETECTED
OPIATE, UR SCREEN: NOT DETECTED
PHENCYCLIDINE (PCP) UR S: NOT DETECTED
Tricyclic, Ur Screen: NOT DETECTED

## 2016-05-24 LAB — RAPID HIV SCREEN (HIV 1/2 AB+AG)
HIV 1/2 Antibodies: NONREACTIVE
HIV-1 P24 Antigen - HIV24: NONREACTIVE

## 2016-05-24 LAB — CBC
HCT: 30.7 % — ABNORMAL LOW (ref 35.0–47.0)
HEMOGLOBIN: 10.9 g/dL — AB (ref 12.0–16.0)
MCH: 27.1 pg (ref 26.0–34.0)
MCHC: 35.4 g/dL (ref 32.0–36.0)
MCV: 76.4 fL — ABNORMAL LOW (ref 80.0–100.0)
PLATELETS: 242 10*3/uL (ref 150–440)
RBC: 4.01 MIL/uL (ref 3.80–5.20)
RDW: 14.8 % — ABNORMAL HIGH (ref 11.5–14.5)
WBC: 10.4 10*3/uL (ref 3.6–11.0)

## 2016-05-24 LAB — TYPE AND SCREEN
ABO/RH(D): B POS
ANTIBODY SCREEN: NEGATIVE

## 2016-05-24 MED ORDER — OXYTOCIN 10 UNIT/ML IJ SOLN
INTRAMUSCULAR | Status: AC
  Filled 2016-05-24: qty 2

## 2016-05-24 MED ORDER — OXYTOCIN BOLUS FROM INFUSION
500.0000 mL | Freq: Once | INTRAVENOUS | Status: DC
  Administered 2016-05-24: 500 mL via INTRAVENOUS

## 2016-05-24 MED ORDER — DIBUCAINE 1 % RE OINT: 1.0000 "application " | TOPICAL_OINTMENT | RECTAL | Status: DC | PRN

## 2016-05-24 MED ORDER — SIMETHICONE 80 MG PO CHEW: 80.0000 mg | CHEWABLE_TABLET | ORAL | Status: DC | PRN

## 2016-05-24 MED ORDER — ONDANSETRON HCL 4 MG/2ML IJ SOLN: 4.0000 mg | INTRAMUSCULAR | Status: DC | PRN

## 2016-05-24 MED ORDER — ACETAMINOPHEN 325 MG PO TABS: 650.0000 mg | ORAL_TABLET | ORAL | Status: DC | PRN

## 2016-05-24 MED ORDER — OXYCODONE-ACETAMINOPHEN 5-325 MG PO TABS
1.0000 | ORAL_TABLET | ORAL | Status: DC | PRN
  Administered 2016-05-24: 1 via ORAL
  Filled 2016-05-24: qty 1

## 2016-05-24 MED ORDER — MISOPROSTOL 200 MCG PO TABS
ORAL_TABLET | ORAL | Status: DC
Start: 2016-05-24 — End: 2016-05-24
  Filled 2016-05-24: qty 4

## 2016-05-24 MED ORDER — SODIUM CHLORIDE FLUSH 0.9 % IV SOLN
INTRAVENOUS | Status: AC
  Administered 2016-05-24: 3 mL via INTRAVENOUS
  Filled 2016-05-24: qty 10

## 2016-05-24 MED ORDER — ZOLPIDEM TARTRATE 5 MG PO TABS: 5.0000 mg | ORAL_TABLET | Freq: Every evening | ORAL | Status: DC | PRN

## 2016-05-24 MED ORDER — LACTATED RINGERS IV SOLN: 500.0000 mL | INTRAVENOUS | Status: DC | PRN

## 2016-05-24 MED ORDER — BENZOCAINE-MENTHOL 20-0.5 % EX AERO: 1.0000 "application " | INHALATION_SPRAY | CUTANEOUS | Status: DC | PRN

## 2016-05-24 MED ORDER — ONDANSETRON HCL 4 MG/2ML IJ SOLN: 4.0000 mg | Freq: Four times a day (QID) | INTRAMUSCULAR | Status: DC | PRN

## 2016-05-24 MED ORDER — PRENATAL MULTIVITAMIN CH
1.0000 | ORAL_TABLET | Freq: Every day | ORAL | Status: DC
  Administered 2016-05-25 – 2016-05-26 (×2): 1 via ORAL
  Filled 2016-05-24 (×2): qty 1

## 2016-05-24 MED ORDER — BUTORPHANOL TARTRATE 1 MG/ML IJ SOLN: 1.0000 mg | INTRAMUSCULAR | Status: DC | PRN

## 2016-05-24 MED ORDER — MEASLES, MUMPS & RUBELLA VAC ~~LOC~~ INJ: 0.5000 mL | INJECTION | Freq: Once | SUBCUTANEOUS | Status: DC

## 2016-05-24 MED ORDER — WITCH HAZEL-GLYCERIN EX PADS: 1.0000 "application " | MEDICATED_PAD | CUTANEOUS | Status: DC | PRN

## 2016-05-24 MED ORDER — DIPHENHYDRAMINE HCL 25 MG PO CAPS: 25.0000 mg | ORAL_CAPSULE | Freq: Four times a day (QID) | ORAL | Status: DC | PRN

## 2016-05-24 MED ORDER — OXYCODONE-ACETAMINOPHEN 5-325 MG PO TABS: 2.0000 | ORAL_TABLET | ORAL | Status: DC | PRN

## 2016-05-24 MED ORDER — LACTATED RINGERS IV SOLN: INTRAVENOUS | Status: DC

## 2016-05-24 MED ORDER — LIDOCAINE HCL (PF) 1 % IJ SOLN: 30.0000 mL | INTRAMUSCULAR | Status: DC | PRN

## 2016-05-24 MED ORDER — OXYTOCIN 40 UNITS IN LACTATED RINGERS INFUSION - SIMPLE MED: 2.5000 [IU]/h | INTRAVENOUS | Status: DC

## 2016-05-24 MED ORDER — HYDROCODONE-ACETAMINOPHEN 5-325 MG PO TABS
1.0000 | ORAL_TABLET | ORAL | Status: DC | PRN
  Administered 2016-05-25: 1 via ORAL
  Filled 2016-05-24: qty 1

## 2016-05-24 MED ORDER — SENNOSIDES-DOCUSATE SODIUM 8.6-50 MG PO TABS
2.0000 | ORAL_TABLET | ORAL | Status: DC
  Filled 2016-05-24: qty 2

## 2016-05-24 MED ORDER — IBUPROFEN 600 MG PO TABS
600.0000 mg | ORAL_TABLET | Freq: Four times a day (QID) | ORAL | Status: DC
  Administered 2016-05-24 – 2016-05-26 (×8): 600 mg via ORAL
  Filled 2016-05-24 (×9): qty 1

## 2016-05-24 MED ORDER — SOD CITRATE-CITRIC ACID 500-334 MG/5ML PO SOLN
30.0000 mL | ORAL | Status: DC | PRN
  Filled 2016-05-24: qty 30

## 2016-05-24 MED ORDER — COCONUT OIL OIL
1.0000 "application " | TOPICAL_OIL | Status: DC | PRN
  Administered 2016-05-26: 1 via TOPICAL
  Filled 2016-05-24: qty 120

## 2016-05-24 MED ORDER — LIDOCAINE HCL (PF) 1 % IJ SOLN
INTRAMUSCULAR | Status: AC
  Filled 2016-05-24: qty 30

## 2016-05-24 MED ORDER — OXYTOCIN 40 UNITS IN LACTATED RINGERS INFUSION - SIMPLE MED
INTRAVENOUS | Status: AC
  Administered 2016-05-24: 500 mL via INTRAVENOUS
  Filled 2016-05-24: qty 1000

## 2016-05-24 MED ORDER — DOCUSATE SODIUM 100 MG PO CAPS
100.0000 mg | ORAL_CAPSULE | Freq: Two times a day (BID) | ORAL | Status: DC
  Administered 2016-05-24 – 2016-05-26 (×4): 100 mg via ORAL
  Filled 2016-05-24 (×4): qty 1

## 2016-05-24 MED ORDER — ONDANSETRON HCL 4 MG PO TABS: 4.0000 mg | ORAL_TABLET | ORAL | Status: DC | PRN

## 2016-05-24 MED ORDER — AMMONIA AROMATIC IN INHA
RESPIRATORY_TRACT | Status: DC
Start: 2016-05-24 — End: 2016-05-24
  Filled 2016-05-24: qty 10

## 2016-05-24 NOTE — H&P (Signed)
Obstetric History and Physical  Terri Bell is a 30 y.o. [id number] with IUP at 793w6d presenting for complaints of painful contractions since 0930.  Patient states she has been having  regular, every 2-3 minutes contractions, none vaginal bleeding, intact membranes, with active fetal movement.    Prenatal Course Source of Care: [facility] with onset of care at 10 weeks Pregnancy complications or risks: Patient Active Problem List   Diagnosis Date Noted  . Poor weight gain of pregnancy 01/29/2016  . 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 Rubella non-immune status, antepartum 11/14/2015  . Maternal varicella, non-immune 11/14/2015  . 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   Prenatal labs and studies: ABO, Rh: B/Positive/-- (03/14 1225) Antibody: Negative (03/14 1225) Rubella: <20.0 (03/14 1225) RPR: Non Reactive (03/14 1225)  HBsAg: Negative (03/14 1225)  HIV: Non Reactive (03/14 1225)  GBS: Negative (09/07 1442) 1 hr Glucola  Normal Genetic screening normal Anatomy US normal   Past Medical History:  Diagnosis Date  . Increased BMI   . Sickle cell trait (HCC) increased bmi  . Vaginal Pap smear, abnormal 08/02/2014   ascus/pos hpv- normal colpo; repeat 3 months pp    Past Surgical History:  Procedure Laterality Date  . NO PAST SURGERIES      OB History  Gravida Para Term Preterm AB Living  5 1 1   2 2   SAB TAB Ectopic Multiple Live Births  2     0 2    # Outcome Date GA Lbr Len/2nd Weight Sex Delivery Anes PTL Lv  5 Current           4 SAB 08/2015        FD  3 Term 01/02/15 5642w3d 09:03 / 00:19 8 lb 4.3 oz (3.751 kg) M Vag-Spont None  LIV     Birth Comments: none  2 SAB 10/2006        FD  1 Gravida 07/26/06   8 lb 5 oz (3.771 kg) M Vag-Spont EPI  LIV    Obstetric Comments  08/2015 pt thinks she had miscarriage, not seen by physcian.     Social History   Social History  . Marital status: Single    Spouse name: N/A  . Number of children: N/A  . Years of education: N/A   Social History Main Topics  . Smoking status: Former Games developermoker  . Smokeless tobacco: Never Used  . Alcohol use No  . Drug use: No  . Sexual activity: Yes    Birth control/ protection: None     Comment: Pregnant    Other Topics Concern  . Not on file   Social History Narrative  . No narrative on file    Family History  Problem Relation Age of Onset  . Thyroid disease Paternal Aunt   . Diabetes Neg Hx   . Heart disease Neg Hx   . Breast cancer Neg Hx   . Ovarian cancer Neg Hx   . Colon cancer Neg Hx     Prescriptions Prior to Admission  Medication Sig Dispense Refill Last Dose  . Butalbital-APAP-Caffeine 50-325-40 MG capsule Take 1-2 capsules by mouth every 6 (six) hours as needed for headache. 30 capsule 3 Taking  . Prenatal Vit-Fe Fumarate-FA (PRENATAL MULTIVITAMIN) TABS tablet Take 1 tablet by mouth daily at 12 noon.   Taking    Allergies  Allergen Reactions  .  Amoxicillin Hives  . Penicillins Hives    Review of Systems: Negative except for what is mentioned in HPI.  Physical Exam: Blood pressure 114/80   Pulse 61  Temp 97.6 F (36.4 C)   Ht 5\' 2"  (1.575 m)   LMP 09/02/2015 (Approximate)  CONSTITUTIONAL: Well-developed, well-nourished female in no acute distress.  HENT:  Normocephalic, atraumatic, External right and left ear normal. Oropharynx is clear and moist EYES: Conjunctivae and EOM are normal. Pupils are equal, round, and reactive to light. No scleral icterus.  NECK: Normal range of motion, supple, no masses SKIN: Skin is warm and dry. No rash noted. Not diaphoretic. No erythema. No pallor. NEUROLOGIC: Alert and oriented to person, place, and time. Normal reflexes, muscle tone coordination. No cranial nerve deficit noted. PSYCHIATRIC: Normal mood and affect. Normal behavior. Normal judgment and thought  content. CARDIOVASCULAR: Normal heart rate noted, regular rhythm RESPIRATORY: Effort and breath sounds normal, no problems with respiration noted ABDOMEN: Soft, nontender, nondistended, gravid. MUSCULOSKELETAL: Normal range of motion. No edema and no tenderness. 2+ distal pulses.  Cervical Exam: Dilatation 10cm   Effacement 100%   Station +1   Presentation: cephalic FHT:  Baseline rate 150 bpm   Variability moderate  Accelerations present   Decelerations none Contractions: Every 2-3 mins   Pertinent Labs/Studies:   No results found for this or any previous visit (from the past 24 hour(s)).  Assessment : Terri Bell is a 30 y.o. [id number] at [redacted]w[redacted]d being admitted for active labor at term with imminent delivery.  Plan: Labor: Expectant management.   FWB: Reassuring fetal heart tracing.  GBS negative Delivery plan: Patient had sucessful vaginal delivery   Hildred Laser, MD [facility]

## 2016-05-25 LAB — CBC
HCT: 30.5 % — ABNORMAL LOW (ref 35.0–47.0)
Hemoglobin: 10.2 g/dL — ABNORMAL LOW (ref 12.0–16.0)
MCH: 26.6 pg (ref 26.0–34.0)
MCHC: 33.4 g/dL (ref 32.0–36.0)
MCV: 79.6 fL — ABNORMAL LOW (ref 80.0–100.0)
Platelets: 235 10*3/uL (ref 150–440)
RBC: 3.82 MIL/uL (ref 3.80–5.20)
RDW: 14.6 % — AB (ref 11.5–14.5)
WBC: 11.8 10*3/uL — ABNORMAL HIGH (ref 3.6–11.0)

## 2016-05-25 LAB — RPR: RPR: NONREACTIVE

## 2016-05-25 LAB — VARICELLA ZOSTER ANTIBODY, IGG: Varicella IgG: 957 index (ref >165)

## 2016-05-25 LAB — RUBELLA SCREEN: RUBELLA: 9.82 {index} (ref >0.99)

## 2016-05-25 MED ORDER — INFLUENZA VAC SPLIT QUAD 0.5 ML IM SUSY: 0.5000 mL | PREFILLED_SYRINGE | INTRAMUSCULAR | Status: DC | PRN

## 2016-05-25 NOTE — Progress Notes (Addendum)
Post Partum Day # 1, s/p SVD  Subjective: no complaints, up ad lib, voiding and tolerating PO  Objective: Temp:  [97.6 F (36.4 C)-98.4 F (36.9 C)] 98 F (36.7 C) (09/24 0751) Pulse Rate:  [57-69] 61 (09/24 0751) Resp:  [14-20] 18 (09/24 0751) BP: (95-118)/(50-94) 99/57 (09/24 0751) SpO2:  [98 %-100 %] 100 % (09/24 0751) Weight:  [188 lb (85.3 kg)] 188 lb (85.3 kg) (09/23 1453)  Physical Exam:  General: alert and no distress  Lungs: clear to auscultation bilaterally Breasts: normal appearance, no masses or tenderness Heart: regular rate and rhythm, S1, S2 normal, no murmur, click, rub or gallop Pelvis: Lochia: appropriate, Uterine Fundus: firm Extremities: DVT Evaluation: No evidence of DVT seen on physical exam.  Negative Homan's sign. No cords or calf tenderness.   Recent Labs  05/24/16 1307 05/25/16 0628  HGB 10.9* 10.2*  HCT 30.7* 30.5*    Assessment/Plan: S/p routine SVD, doing well.  Continue routine postpartum care. Breastfeeding Circumcision desired, to be performed outpatient.  Contraception considering OCPs now (previously considered the patch).  Mild anemia of pregnancy, asymptomatic. Continue PNV with iron.  Declines flu vaccine prior to discharge.   Plan for discharge tomorrow.    LOS: 1 day   Terri Bell [facility]

## 2016-05-26 MED ORDER — IBUPROFEN 600 MG PO TABS: 600.0000 mg | ORAL_TABLET | Freq: Four times a day (QID) | ORAL | 1 refills | Status: DC

## 2016-05-26 NOTE — Discharge Instructions (Signed)

## 2016-05-26 NOTE — Discharge Summary (Signed)
Obstetric Discharge Summary Reason for Admission: onset of labor Prenatal Procedures: ultrasound Intrapartum Procedures: spontaneous vaginal delivery Postpartum Procedures: none Complications-Operative and Postpartum: none Hemoglobin  Date Value Ref Range Status  05/25/2016 10.2 (L) 12.0 - 16.0 g/dL Final  16/10/960410/20/2015 54.011.1 g/dL Final  98/11/914710/20/2015 82.911.1 g/dL Final   HGB  Date Value Ref Range Status  06/26/2014 11.7 (L) 12.0 - 16.0 g/dL Final   HCT  Date Value Ref Range Status  05/25/2016 30.5 (L) 35.0 - 47.0 % Final  06/20/2014 33 % Final  06/20/2014 33 % Final   Hematocrit  Date Value Ref Range Status  11/13/2015 35.1 34.0 - 46.6 % Final    Physical Exam:  General: alert and no distress Lochia: appropriate Uterine Fundus: firm Incision: None DVT Evaluation: No evidence of DVT seen on physical exam. Negative Homan's sign. No cords or calf tenderness. No significant calf/ankle edema.  Discharge Diagnoses: Term Pregnancy-delivered  Discharge Information: Date: 05/26/2016 Activity: pelvic rest Diet: routine Medications: PNV and Ibuprofen Condition: stable Instructions: refer to practice specific booklet Discharge to: home Follow-up Information    Hildred LaserAnika Shanara Schnieders, MD Follow up in 6 week(s).   Specialties:  Obstetrics and Gynecology, Radiology Why:  For postpartum visit Contact information: [address] [place] [id number] [phone]           Newborn Data: Live born female  Birth Weight: 6 lb 13 oz (3090 g) APGAR: 9, 9  Home with mother.  [name] Tallan Sandoz 05/26/2016, 10:50 AM

## 2016-05-26 NOTE — Progress Notes (Signed)
Post Partum Day # 2, s/p SVD  Subjective: no complaints, up ad lib, voiding and tolerating PO  Objective: Blood pressure 99/60, pulse 78, temperature 98.5 F (36.9 C), temperature source Oral, resp. rate 18, height 5\' 2"  (1.575 m), weight 188 lb (85.3 kg), last menstrual period 09/02/2015, SpO2 100 %, not currently breastfeeding.  Physical Exam:  General: alert and no distress  Lungs: clear to auscultation bilaterally Breasts: normal appearance, no masses or tenderness Heart: regular rate and rhythm, S1, S2 normal, no murmur, click, rub or gallop Pelvis: Lochia: appropriate, Uterine Fundus: firm Extremities: DVT Evaluation: No evidence of DVT seen on physical exam.  Negative Homan's sign. No cords or calf tenderness.   Recent Labs  05/24/16 1307 05/25/16 0628  HGB 10.9* 10.2*  HCT 30.7* 30.5*    Assessment/Plan: S/p routine SVD, doing well.  Continue routine postpartum care. Breastfeeding Circumcision desired, to be performed outpatient.  Contraception considering OCPs now.  Mild anemia of pregnancy, asymptomatic. Continue PNV with iron.  Declines flu vaccine prior to discharge.   Plan for discharge home today.    LOS: 2 days   Hildred LaserAnika Romonia Yanik [facility]

## 2016-05-26 NOTE — Progress Notes (Signed)
Pt discharged home with infant.  Discharge instructions and follow up appointment given to and reviewed with pt.  Pt verbalized understanding.  Escorted by auxillary. 

## 2016-05-27 ENCOUNTER — Telehealth: Payer: Self-pay | Admitting: Obstetrics and Gynecology

## 2016-05-27 NOTE — Telephone Encounter (Signed)
Called pt she states that she is 3days post partum and is passing large golfball sized clots. Advised pt that heavy bleeding and clots are normal post delivery. Pt still very anxious. Advised pt I would call back within the hour to re access.

## 2016-05-27 NOTE — Telephone Encounter (Signed)
PT HAD BABY 9/23 AND HAD A BLOOD CLOT COME OUT JUST NOW AS BIG AS A GOLF BALL

## 2016-05-27 NOTE — Telephone Encounter (Signed)
Called pt back she states that she has not passed anymore clots and has been resting. Feels better.

## 2016-05-29 ENCOUNTER — Encounter: Payer: Medicaid Other | Admitting: Obstetrics and Gynecology

## 2016-06-05 ENCOUNTER — Encounter: Payer: Medicaid Other | Admitting: Obstetrics and Gynecology

## 2016-06-16 ENCOUNTER — Telehealth: Payer: Self-pay | Admitting: Obstetrics and Gynecology

## 2016-06-16 NOTE — Telephone Encounter (Signed)
Patient called stating she has mastitis. [facility] and is taking clyndomicin. She states the right breast is still hot to touch and swollen. She has doing hot compresses, she denies any fever or chills. Please Advise.

## 2016-06-16 NOTE — Telephone Encounter (Signed)
Called pt she states that she was seen in the ED for mastitis over the weekend, pt states that her breast is feeling a little better but is still in a lot of pain. Notes that LT breast is now becoming engorged as she has not been nursing from that side either. Strongly advised pt to resume nursing and pumping from both sides frequently. Advised on increased water intake, tylenol for pain, warm showers, warm compresses. Pt to continue ATB, to call back if no improvement.

## 2016-06-19 ENCOUNTER — Encounter: Payer: Self-pay | Admitting: Obstetrics and Gynecology

## 2016-06-19 ENCOUNTER — Ambulatory Visit (INDEPENDENT_AMBULATORY_CARE_PROVIDER_SITE_OTHER): Payer: Medicaid Other | Admitting: Obstetrics and Gynecology

## 2016-06-19 VITALS — BP 118/83 | HR 71 | Temp 98.8°F | Ht 62.0 in | Wt 170.0 lb

## 2016-06-19 DIAGNOSIS — N61 Mastitis without abscess: Secondary | ICD-10-CM

## 2016-06-19 NOTE — Progress Notes (Signed)
Chief complaint: 1. Mastitis  Postpartum patient breast-feeding with recently diagnosed mastitis on 06/14/2016. Patient was placed on Cleocin 300 mg 3 times a day; was encouraged to pump and dump; Is not currently experiencing fevers or chills.   OBJECTIVE: BP 118/83   Pulse 71   Temp 98.8 F (37.1 C)   Ht 5\' 2"  (1.575 m)   Wt 170 lb (77.1 kg)   Breastfeeding? Yes   BMI 31.09 kg/m  Pleasant female in no acute distress. Breasts:  Right-3 x 3 cm area of induration and erythema at the 2:00 position, 2/4 tender, without fluctuance; upper outer quadrant feels glandular and full without tenderness or erythema  Left-normal lactation changes Adenopathy: Negative  ASSESSMENT: 1. Postpartum mastitis 2. Penicillin allergy  PLAN: 1. Continue clindamycin 300 mg 3 times a day for a total of 14 days 2. Return in 1 week for follow-up or sooner if symptoms worsen 3. Maintain hydration 4. Continue to pump and dump from the right breast  A total of 15 minutes were spent face-to-face with the patient during this encounter and over half of that time dealt with counseling and coordination of care.  Herold HarmsMartin A Defrancesco, MD  Note: This dictation was prepared with Dragon dictation along with smaller phrase technology. Any transcriptional errors that result from this process are unintentional.

## 2016-06-19 NOTE — Patient Instructions (Addendum)
External cephalic version ECV. Unless patient. Breastfeeding and Mastitis Mastitis is inflammation of the breast tissue. It can occur in women who are breastfeeding. This can make breastfeeding painful. Mastitis will sometimes go away on its own. Your health care provider will help determine if treatment is needed. CAUSES Mastitis is often associated with a blocked milk (lactiferous) duct. This can happen when too much milk builds up in the breast. Causes of excess milk in the breast can include:  Poor latch-on. If your baby is not latched onto the breast properly, she or he may not empty your breast completely while breastfeeding.  Allowing too much time to pass between feedings.  Wearing a bra or other clothing that is too tight. This puts extra pressure on the lactiferous ducts so milk does not flow through them as it should. Mastitis can also be caused by a bacterial infection. Bacteria may enter the breast tissue through cuts or openings in the skin. In women who are breastfeeding, this may occur because of cracked or irritated skin. Cracks in the skin are often caused when your baby does not latch on properly to the breast. SIGNS AND SYMPTOMS  Swelling, redness, tenderness, and pain in an area of the breast.  Swelling of the glands under the arm on the same side.  Fever may or may not accompany mastitis. If an infection is allowed to progress, a collection of pus (abscess) may develop. DIAGNOSIS  Your health care provider can usually diagnose mastitis based on your symptoms and a physical exam. Tests may be done to help confirm the diagnosis. These may include:  Removal of pus from the breast by applying pressure to the area. This pus can be examined in the lab to determine which bacteria are present. If an abscess has developed, the fluid in the abscess can be removed with a needle. This can also be used to confirm the diagnosis and determine the bacteria present. In most cases, pus will  not be present.  Blood tests to determine if your body is fighting a bacterial infection.  Mammogram or ultrasound tests to rule out other problems or diseases. TREATMENT  Mastitis that occurs with breastfeeding will sometimes go away on its own. Your health care provider may choose to wait 24 hours after first seeing you to decide whether a prescription medicine is needed. If your symptoms are worse after 24 hours, your health care provider will likely prescribe an antibiotic medicine to treat the mastitis. He or she will determine which bacteria are most likely causing the infection and will then select an appropriate antibiotic medicine. This is sometimes changed based on the results of tests performed to identify the bacteria, or if there is no response to the antibiotic medicine selected. Antibiotic medicines are usually given by mouth. You may also be given medicine for pain. HOME CARE INSTRUCTIONS  Only take over-the-counter or prescription medicines for pain, fever, or discomfort as directed by your health care provider.  If your health care provider prescribed an antibiotic medicine, take the medicine as directed. Make sure you finish it even if you start to feel better.  Do not wear a tight or underwire bra. Wear a soft, supportive bra.  Increase your fluid intake, especially if you have a fever.  Continue to empty the breast. Your health care provider can tell you whether this milk is safe for your infant or needs to be thrown out. You may be told to stop nursing until your health care provider thinks  it is safe for your baby. Use a breast pump if you are advised to stop nursing.  Keep your nipples clean and dry.  Empty the first breast completely before going to the other breast. If your baby is not emptying your breasts completely for some reason, use a breast pump to empty your breasts.  If you go back to work, pump your breasts while at work to stay in time with your nursing  schedule.  Avoid allowing your breasts to become overly filled with milk (engorged). SEEK MEDICAL CARE IF:  You have pus-like discharge from the breast.  Your symptoms do not improve with the treatment prescribed by your health care provider within 2 days. SEEK IMMEDIATE MEDICAL CARE IF:  Your pain and swelling are getting worse.  You have pain that is not controlled with medicine.  You have a red line extending from the breast toward your armpit.  You have a fever or persistent symptoms for more than 2-3 days.  You have a fever and your symptoms suddenly get worse. MAKE SURE YOU:   Understand these instructions.  Will watch your condition.  Will get help right away if you are not doing well or get worse.   This information is not intended to replace advice given to you by your health care provider. Make sure you discuss any questions you have with your health care provider.   Document Released: 12/13/2004 Document Revised: 08/23/2013 Document Reviewed: 03/24/2013 Elsevier Interactive Patient Education [employer]2016 [employer] Inc.

## 2016-06-26 ENCOUNTER — Ambulatory Visit (INDEPENDENT_AMBULATORY_CARE_PROVIDER_SITE_OTHER): Payer: Medicaid Other | Admitting: Obstetrics and Gynecology

## 2016-06-26 VITALS — BP 104/71 | HR 75 | Ht 62.0 in | Wt 169.8 lb

## 2016-06-26 DIAGNOSIS — N61 Mastitis without abscess: Secondary | ICD-10-CM

## 2016-06-26 NOTE — Patient Instructions (Addendum)
1. No further antibiotics needed 2. Continue nursing bilaterally 3. Return if breast pain, fever or chills develop.

## 2016-06-26 NOTE — Progress Notes (Signed)
Chief complaint: 1. Acute mastitis  Patient presents for follow-up on mastitis. She has completed a 10 day course of clindamycin. She is not experiencing any fever or chills or sweats. She is not experiencing any significant breast tenderness except when baby latches onto the right breast. Milk production is not as good in the right breast as the left.  OBJECTIVE: BP 104/71   Pulse 75   Ht 5\' 2"  (1.575 m)   Wt 169 lb 12.8 oz (77 kg)   Breastfeeding? Yes   BMI 31.06 kg/m  Well-appearing female in no acute distress Neck: Supple without adenopathy or tenderness Breasts: Bilateral lactation changes, left greater than right; 2 x 3 cm area of induration is noted in the region of the previously identified breast lump, nontender; skin overlying the induration is slightly chafed.  ASSESSMENT: 1. Acute mastitis, right breast, resolved  PLAN: 1. No further antibiotics necessary 2. Continue nursing on both breasts 3. Follow-up as needed if any mastitis symptoms recur.  A total of 15 minutes were spent face-to-face with the patient during this encounter and over half of that time dealt with counseling and coordination of care.  Herold HarmsMartin A Kevina Piloto, MD  Note: This dictation was prepared with Dragon dictation along with smaller phrase technology. Any transcriptional errors that result from this process are unintentional.

## 2016-07-08 ENCOUNTER — Encounter: Payer: Self-pay | Admitting: Obstetrics and Gynecology

## 2016-07-08 ENCOUNTER — Ambulatory Visit (INDEPENDENT_AMBULATORY_CARE_PROVIDER_SITE_OTHER): Payer: Medicaid Other | Admitting: Obstetrics and Gynecology

## 2016-07-08 DIAGNOSIS — F53 Postpartum depression: Secondary | ICD-10-CM

## 2016-07-08 DIAGNOSIS — O9081 Anemia of the puerperium: Secondary | ICD-10-CM

## 2016-07-08 DIAGNOSIS — Z23 Encounter for immunization: Secondary | ICD-10-CM | POA: Diagnosis not present

## 2016-07-08 DIAGNOSIS — O99345 Other mental disorders complicating the puerperium: Secondary | ICD-10-CM

## 2016-07-08 NOTE — Progress Notes (Addendum)
   OBSTETRICS POSTPARTUM CLINIC PROGRESS NOTE  Subjective:     Terri Bell is a 30 y.o. [phone] female who presents for a postpartum visit. She is 7 weeks postpartum following a spontaneous vaginal delivery. I have fully reviewed the prenatal and intrapartum course. The delivery was at 7 gestational weeks.  Anesthesia: none. Postpartum course has been well. Baby's course has been well. Baby is feeding by both breast and bottle. Bleeding:/has not resumed menses, with No LMP recorded.. Bowel function is normal. Bladder function is normal. Patient is not sexually active. Contraception method desired is Nexplanon. Postpartum depression screening: positive (PHQ-9 score is 11)  The following portions of the patient's history were reviewed and updated as appropriate: allergies, current medications, past family history, past medical history, past social history, past surgical history and problem list.  Review of Systems Pertinent items noted in HPI and remainder of comprehensive ROS otherwise negative.   Objective:    BP 105/68 (BP Location: Left Arm, Patient Position: Sitting, Cuff Size: Normal)   Pulse 80   Ht 5\' 2"  (1.575 m)   Wt 170 lb 14.4 oz (77.5 kg)   Breastfeeding? Yes   BMI 31.26 kg/m   General:  alert and no distress   Breasts:  inspection negative, no nipple discharge or bleeding, no masses or nodularity palpable  Lungs: clear to auscultation bilaterally  Heart:  regular rate and rhythm, S1, S2 normal, no murmur, click, rub or gallop  Abdomen: soft, non-tender; bowel sounds normal; no masses,  no organomegaly.     Vulva:  normal  Vagina: normal vagina, no discharge, exudate, lesion, or erythema  Cervix:  no cervical motion tenderness and no lesions  Corpus: normal size, contour, position, consistency, mobility, non-tender  Adnexa:  normal adnexa and no mass, fullness, tenderness  Rectal Exam: Not performed.         Labs:  Lab Results  Component Value Date   HGB 10.2 (L)  05/25/2016     Assessment:    Routine postpartum exam.   Mild anemia, postpartum Postpartum depression screen positive   Plan:   1. Contraception: Nexplanon desired.  Scheduled. 2. Anemia mild, asymptomatic, 3. Positive postpartum depression screen.  Discussed with patient, who notes that she does not feel depressed.  Notes that she is overall stressed with having 2 small children (other child is only 28 year old), and that FOB is not as involved as she thought he would be.  Discussed referral to counselor for discussion/management, patient declines.  4. Follow up in: 1 week for Nexplanon  or as needed.  Will also f/u on postpartum symptoms.    Hildred LaserAnika Lindora Alviar, MD [facility]

## 2016-07-30 ENCOUNTER — Encounter: Payer: Medicaid Other | Admitting: Obstetrics and Gynecology

## 2016-08-27 ENCOUNTER — Encounter: Payer: Medicaid Other | Admitting: Obstetrics and Gynecology

## 2016-09-09 ENCOUNTER — Encounter: Payer: Self-pay | Admitting: Obstetrics and Gynecology

## 2016-09-09 ENCOUNTER — Ambulatory Visit (INDEPENDENT_AMBULATORY_CARE_PROVIDER_SITE_OTHER): Payer: Medicaid Other | Admitting: Obstetrics and Gynecology

## 2016-09-09 VITALS — BP 91/48 | HR 94 | Ht 62.0 in | Wt 178.1 lb

## 2016-09-09 DIAGNOSIS — Z30017 Encounter for initial prescription of implantable subdermal contraceptive: Secondary | ICD-10-CM

## 2016-09-09 LAB — POCT URINE PREGNANCY: PREG TEST UR: NEGATIVE

## 2016-09-09 NOTE — Progress Notes (Signed)
     GYNECOLOGY OFFICE PROCEDURE NOTE  Terri Bell is a 31 y.o. [id number] here for Nexplanon insertion.  Last pap smear was on 11/27/2015 and was normal.  No other gynecologic concerns.  Nexplanon Insertion Procedure Patient identified, informed consent performed, consent signed.   Patient does understand that irregular bleeding is a very common side effect of this medication. She was advised to have backup contraception for one week after placement. Pregnancy test in clinic today was negative.  Appropriate time out taken.  Patient's left arm was prepped and draped in the usual sterile fashion. The ruler used to measure and mark insertion area.  Patient was prepped with alcohol swab and then injected with 3 ml of 1% lidocaine.  She was prepped with betadine, Nexplanon removed from packaging,  Device confirmed in needle, then inserted full length of needle and withdrawn per handbook instructions. Nexplanon was able to palpated in the patient's arm; patient palpated the insert herself. There was minimal blood loss.  Patient insertion site covered with guaze and a pressure bandage to reduce any bruising.  The patient tolerated the procedure well and was given post procedure instructions.    Lot: [id number] Exp: 12/2018   Hildred LaserAnika Cedarius Kersh, MD [facility]

## 2016-10-30 ENCOUNTER — Encounter: Payer: Self-pay | Admitting: Obstetrics and Gynecology

## 2016-10-30 ENCOUNTER — Ambulatory Visit (INDEPENDENT_AMBULATORY_CARE_PROVIDER_SITE_OTHER): Payer: Medicaid Other | Admitting: Obstetrics and Gynecology

## 2016-10-30 VITALS — BP 97/61 | HR 71 | Ht 62.0 in | Wt 185.2 lb

## 2016-10-30 DIAGNOSIS — N9489 Other specified conditions associated with female genital organs and menstrual cycle: Secondary | ICD-10-CM

## 2016-10-30 DIAGNOSIS — Z975 Presence of (intrauterine) contraceptive device: Secondary | ICD-10-CM | POA: Diagnosis not present

## 2016-10-30 MED ORDER — TRAMADOL HCL 50 MG PO TABS: 50.0000 mg | ORAL_TABLET | Freq: Four times a day (QID) | ORAL | 0 refills | Status: DC | PRN

## 2016-10-30 NOTE — Progress Notes (Signed)
    GYNECOLOGY PROGRESS NOTE  Subjective:    Patient ID: Terri Bell, female    DOB: 02/04/1986, 31 y.o.   MRN: [id number]  HPI  Patient is a 31 y.o. [id number] female who presents for complaints regarding her Nexplanon contraception.  Patient reports continuous daily non-cyclic moderately painful cramping since Nexplanon was inserted.  Has tried OTC pain relievers (has tried Motrin leftover from previous delivery, ES Tylenol, and Midol) and heating pads without relief.  Notes bleeding is ok, has had 1 normal cycle since insertion last month. Patient thinks that she would like to have it removed.  Denies abnormal vaginal discharge.    The following portions of the patient's history were reviewed and updated as appropriate: allergies, current medications, past family history, past medical history, past social history, past surgical history and problem list.  Review of Systems Pertinent items noted in HPI and remainder of comprehensive ROS otherwise negative.   Objective:   Blood pressure 97/61, pulse 71, height 5\' 2"  (1.575 m), weight 185 lb 3.2 oz (84 kg), last menstrual period 10/13/2016, not currently breastfeeding. General appearance: alert and no distress Abdomen: soft, non-tender; bowel sounds normal; no masses,  no organomegaly Pelvic: deferred   Assessment:   Pelvic cramping Nexplanon in place  Plan:   - Discussed management of cramping.  Can initiate patient on a 1 month trial of OCPs, and given a prescription for tramadol for moderate pain unrelieved with OTC pain relievers.  To f/u in 1 month for reassessment.  - Nexplanon in place, patient desires removal but will try other recommendations first x 1 month. If still with complaints, can remove at that time.  Discussed other methods for contraception, patient notes that she does not really desire to be on contraception, however does not also desire pregnancy.  Is not consistent with condom use.  Advised patient to consider  all options. Will discuss again at next visit if Nexplanon is removed.
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
## 2016-11-11 ENCOUNTER — Telehealth: Payer: Self-pay | Admitting: Obstetrics and Gynecology

## 2016-11-11 NOTE — Telephone Encounter (Signed)
Patient called stating there is a bump under the skin where the nexplanon is located. She also states she cant feel the nexplanon on one side. Please Advise.

## 2016-11-12 NOTE — Telephone Encounter (Signed)
Pt calls back and states that she wants to have her nexplanon removed dispite being advised that her symptoms are normal, transferred pt to reception to be scheduled for Nexplanon removal.

## 2016-11-12 NOTE — Telephone Encounter (Signed)
Called pt no answer. LM for pt to call back.  

## 2016-11-19 ENCOUNTER — Ambulatory Visit: Payer: Medicaid Other | Admitting: Obstetrics and Gynecology

## 2016-12-02 ENCOUNTER — Encounter: Payer: Medicaid Other | Admitting: Obstetrics and Gynecology

## 2019-03-08 ENCOUNTER — Other Ambulatory Visit: Payer: Self-pay

## 2019-03-08 ENCOUNTER — Encounter: Payer: Self-pay | Admitting: Obstetrics and Gynecology

## 2019-03-08 ENCOUNTER — Ambulatory Visit: Payer: Self-pay | Admitting: Obstetrics and Gynecology

## 2019-03-08 VITALS — BP 110/78 | HR 80 | Ht 62.0 in | Wt 208.1 lb

## 2019-03-08 DIAGNOSIS — Z5321 Procedure and treatment not carried out due to patient leaving prior to being seen by health care provider: Secondary | ICD-10-CM | POA: Diagnosis not present

## 2019-03-08 DIAGNOSIS — K0889 Other specified disorders of teeth and supporting structures: Secondary | ICD-10-CM | POA: Diagnosis present

## 2019-03-08 DIAGNOSIS — Z3046 Encounter for surveillance of implantable subdermal contraceptive: Secondary | ICD-10-CM | POA: Diagnosis not present

## 2019-03-08 NOTE — ED Triage Notes (Signed)
Pt arrives to ED via POV from home with c/o lower left-sided dental pain x2 days. No OTC medications taken for relief.

## 2019-03-08 NOTE — Progress Notes (Signed)
Pt is present to have her nexplanon removed. Pt stated the nexplanon is causing pain in her arm and would like it to be removed. Pt stated that she did not want to replace the nexplanon with any other form of birth control at this time.

## 2019-03-08 NOTE — Progress Notes (Signed)
    GYNECOLOGY OFFICE PROCEDURE NOTE  Terri Bell is a 33 y.o. [id number] here for Nexplanon removal.  Last pap smear was on 02/26/2019 and was normal.  No other gynecologic concerns.  Patient reports complaints of pelvic pain, thinks it may be related to the IUD.  Has had Nexplanon in place for 2.5 years.   Nexplanon Removal Patient identified, informed consent performed, consent signed.   Appropriate time out taken. Nexplanon site identified.  Area prepped in usual sterile fashon. One ml of 1% lidocaine was used to anesthetize the area at the distal end of the implant. A small stab incision was made right beside the implant on the distal portion.  The Nexplanon rod was grasped using hemostats and removed without difficulty.  There was minimal blood loss. There were no complications.  3 ml of 1% lidocaine was injected around the incision for post-procedure analgesia.  Steri-strips were applied over the small incision.  A pressure bandage was applied to reduce any bruising.  The patient tolerated the procedure well and was given post procedure instructions.  Patient is planning to use celibacy for contraception.    Rubie Maid, MD [facility]

## 2019-03-08 NOTE — Patient Instructions (Signed)
Nexplanon Instructions After Removal  Keep bandage clean and dry for 24 hours  May use ice/Tylenol/Ibuprofen for soreness or pain  If you develop fever, drainage or increased warmth from incision site-contact office immediately   

## 2019-03-09 ENCOUNTER — Emergency Department
Admission: EM | Admit: 2019-03-09 | Discharge: 2019-03-09 | Disposition: A | Discharge status: Home / Self Care | Payer: Medicaid Other | Attending: Emergency Medicine | Admitting: Emergency Medicine

## 2019-03-09 NOTE — ED Notes (Signed)
Pt up to STAT desk asking about wait times; explained to pt there are 8-10 people ahead of her waiting to be seen. Pt states she will not wait any longer and will follow-up with her PCP as needed. Pt encouraged to stay, but insists on leaving at this time.

## 2019-05-18 ENCOUNTER — Encounter: Payer: Self-pay | Admitting: Obstetrics and Gynecology

## 2019-05-18 ENCOUNTER — Ambulatory Visit: Payer: Medicaid Other | Admitting: Obstetrics and Gynecology

## 2019-05-18 ENCOUNTER — Other Ambulatory Visit: Payer: Self-pay

## 2019-05-18 VITALS — BP 110/74 | HR 72 | Ht 62.0 in | Wt 193.6 lb

## 2019-05-18 DIAGNOSIS — Z789 Other specified health status: Secondary | ICD-10-CM | POA: Diagnosis not present

## 2019-05-18 DIAGNOSIS — O210 Mild hyperemesis gravidarum: Secondary | ICD-10-CM | POA: Diagnosis not present

## 2019-05-18 DIAGNOSIS — N926 Irregular menstruation, unspecified: Secondary | ICD-10-CM

## 2019-05-18 LAB — POCT URINE PREGNANCY: Preg Test, Ur: POSITIVE — AB

## 2019-05-18 MED ORDER — DOXYLAMINE-PYRIDOXINE 10-10 MG PO TBEC: 2.0000 | DELAYED_RELEASE_TABLET | Freq: Every day | ORAL | 5 refills | Status: DC

## 2019-05-18 MED ORDER — ONDANSETRON HCL 4 MG PO TABS: 4.0000 mg | ORAL_TABLET | Freq: Three times a day (TID) | ORAL | 0 refills | Status: DC | PRN

## 2019-05-18 NOTE — Progress Notes (Signed)
Subjective:    KEEANNA VILLAFRANCA is a 33 y.o. female who presents for evaluation of amenorrhea. She believes she could be pregnant. Pregnancy is desired. Sexual Activity: single partner, contraception: none, had Nexplanon removed in July. Current symptoms also include: morning sickness, nausea and positive home pregnancy test. Last period was abnormal, was in June prior to removal of her Nexplanon in July.   No LMP recorded (lmp unknown). Patient is pregnant.   The following portions of the patient's history were reviewed and updated as appropriate: allergies, current medications, past family history, past medical history, past social history, past surgical history and problem list.  Review of Systems Pertinent items noted in HPI and remainder of comprehensive ROS otherwise negative.     Objective:    BP 110/74   Pulse 72   Ht 5\' 2"  (1.575 m)   Wt 193 lb 9.6 oz (87.8 kg)   LMP  (LMP Unknown)   BMI 35.41 kg/m  General: alert, no distress and no acute distress    Lab Review Urine HCG: positive    Assessment:    Absence of menstruation.    Morning sickness  Plan:    Pregnancy Test: Positive: EDC: unknown due to unknown LMP. Briefly discussed pre-natal care options. Pregnancy first trimester information given. Encouraged well-balanced diet, plenty of rest when needed, pre-natal vitamins daily and walking for exercise. Discussed self-help for nausea, avoiding OTC medications until consulting provider or pharmacist, other than Tylenol as needed, minimal caffeine (1-2 cups daily) and avoiding alcohol. She will schedule her initial OB visit in the next month. Will order ultrasound for dating of pregnancy.  Feel free to call with any questions.      Rubie Maid, MD [facility]

## 2019-05-18 NOTE — Patient Instructions (Signed)
Morning Sickness  Morning sickness is when you feel sick to your stomach (nauseous) during pregnancy. You may feel sick to your stomach and throw up (vomit). You may feel sick in the morning, but you can feel this way at any time of day. Some women feel very sick to their stomach and cannot stop throwing up (hyperemesis gravidarum). Follow these instructions at home: Medicines  Take over-the-counter and prescription medicines only as told by your doctor. Do not take any medicines until you talk with your doctor about them first.  Taking multivitamins before getting pregnant can stop or lessen the harshness of morning sickness. Eating and drinking  Eat dry toast or crackers before getting out of bed.  Eat 5 or 6 small meals a day.  Eat dry and bland foods like rice and baked potatoes.  Do not eat greasy, fatty, or spicy foods.  Have someone cook for you if the smell of food causes you to feel sick or throw up.  If you feel sick to your stomach after taking prenatal vitamins, take them at night or with a snack.  Eat protein when you need a snack. Nuts, yogurt, and cheese are good choices.  Drink fluids throughout the day.  Try ginger ale made with real ginger, ginger tea made from fresh grated ginger, or ginger candies. General instructions  Do not use any products that have nicotine or tobacco in them, such as cigarettes and e-cigarettes. If you need help quitting, ask your doctor.  Use an air purifier to keep the air in your house free of smells.  Get lots of fresh air.  Try to avoid smells that make you feel sick.  Try: ? Wearing a bracelet that is used for seasickness (acupressure wristband). ? Going to a doctor who puts thin needles into certain body points (acupuncture) to improve how you feel. Contact a doctor if:  You need medicine to feel better.  You feel dizzy or light-headed.  You are losing weight. Get help right away if:  You feel very sick to your  stomach and cannot stop throwing up.  You pass out (faint).  You have very bad pain in your belly. Summary  Morning sickness is when you feel sick to your stomach (nauseous) during pregnancy.  You may feel sick in the morning, but you can feel this way at any time of day.  Making some changes to what you eat may help your symptoms go away. This information is not intended to replace advice given to you by your health care provider. Make sure you discuss any questions you have with your health care provider. Document Released: 09/25/2004 Document Revised: 07/31/2017 Document Reviewed: 09/18/2016 Elsevier Patient Education  [place]. Common Medications Safe in Pregnancy  Acne:      Constipation:  Benzoyl Peroxide     Colace  Clindamycin      Dulcolax Suppository  Topica Erythromycin     Fibercon  Salicylic Acid      Metamucil         Miralax AVOID:        Senakot   Accutane    Cough:  Retin-A       Cough Drops  Tetracycline      Phenergan w/ Codeine if Rx  Minocycline      Robitussin (Plain & DM)  Antibiotics:     Crabs/Lice:  Ceclor       RID  Cephalosporins    AVOID:  E-Mycins  Kwell  Keflex  Macrobid/Macrodantin   Diarrhea:  Penicillin      Kao-Pectate  Zithromax      Imodium AD         PUSH FLUIDS AVOID:       Cipro     Fever:  Tetracycline      Tylenol (Regular or Extra  Minocycline       Strength)  Levaquin      Extra Strength-Do not          Exceed 8 tabs/24 hrs Caffeine:        '200mg'$ /day (equiv. To 1 cup of coffee or  approx. 3 12 oz sodas)         Gas: Cold/Hayfever:       Gas-X  Benadryl      Mylicon  Claritin       Phazyme  **Claritin-D        Chlor-Trimeton    Headaches:  Dimetapp      ASA-Free Excedrin  Drixoral-Non-Drowsy     Cold Compress  Mucinex (Guaifenasin)     Tylenol (Regular or Extra  Sudafed/Sudafed-12 Hour     Strength)  **Sudafed PE Pseudoephedrine   Tylenol Cold & Sinus     Vicks Vapor Rub  Zyrtec  **AVOID if Problems  With Blood Pressure         Heartburn: Avoid lying down for at least 1 hour after meals  Aciphex      Maalox     Rash:  Milk of Magnesia     Benadryl    Mylanta       1% Hydrocortisone Cream  Pepcid  Pepcid Complete   Sleep Aids:  Prevacid      Ambien   Prilosec       Benadryl  Rolaids       Chamomile Tea  Tums (Limit 4/day)     Unisom  Zantac       Tylenol PM         Warm milk-add vanilla or  Hemorrhoids:       Sugar for taste  Anusol/Anusol H.C.  (RX: Analapram 2.5%)  Sugar Substitutes:  Hydrocortisone OTC     Ok in moderation  Preparation H      Tucks        Vaseline lotion applied to tissue with wiping    Herpes:     Throat:  Acyclovir      Oragel  Famvir  Valtrex     Vaccines:         Flu Shot Leg Cramps:       *Gardasil  Benadryl      Hepatitis A         Hepatitis B Nasal Spray:       Pneumovax  Saline Nasal Spray     Polio Booster         Tetanus Nausea:       Tuberculosis test or PPD  Vitamin B6 25 mg TID   AVOID:    Dramamine      *Gardasil  Emetrol       Live Poliovirus  Ginger Root 250 mg QID    MMR (measles, mumps &  High Complex Carbs @ Bedtime    rebella)  Sea Bands-Accupressure    Varicella (Chickenpox)  Unisom 1/2 tab TID     *No known complications           If received before Pain:         Known pregnancy;  Darvocet       Resume series after  Lortab        Delivery  Percocet    Yeast:   Tramadol      Femstat  Tylenol 3      Gyne-lotrimin  Ultram       Monistat  Vicodin           MISC:         All Sunscreens           Hair Coloring/highlights          Insect Repellant's          (Including DEET)         Mystic Tans

## 2019-05-18 NOTE — Progress Notes (Signed)
PT is present today for confirmation of pregnancy. Pt LMP unknown. UPT done today results were positive. Pt stated that she is doing well no complaints.   

## 2019-05-19 ENCOUNTER — Ambulatory Visit (INDEPENDENT_AMBULATORY_CARE_PROVIDER_SITE_OTHER): Payer: Medicaid Other

## 2019-05-19 DIAGNOSIS — O210 Mild hyperemesis gravidarum: Secondary | ICD-10-CM

## 2019-05-19 DIAGNOSIS — Z3A08 8 weeks gestation of pregnancy: Secondary | ICD-10-CM | POA: Diagnosis not present

## 2019-05-19 DIAGNOSIS — O3481 Maternal care for other abnormalities of pelvic organs, first trimester: Secondary | ICD-10-CM

## 2019-05-19 DIAGNOSIS — N8311 Corpus luteum cyst of right ovary: Secondary | ICD-10-CM | POA: Diagnosis not present

## 2019-05-19 DIAGNOSIS — Z789 Other specified health status: Secondary | ICD-10-CM

## 2019-05-19 DIAGNOSIS — N926 Irregular menstruation, unspecified: Secondary | ICD-10-CM

## 2019-05-23 ENCOUNTER — Encounter: Payer: Self-pay | Admitting: Surgical

## 2019-06-02 ENCOUNTER — Encounter: Payer: Self-pay | Admitting: *Deleted

## 2019-06-06 ENCOUNTER — Other Ambulatory Visit: Payer: Self-pay

## 2019-06-06 ENCOUNTER — Ambulatory Visit (INDEPENDENT_AMBULATORY_CARE_PROVIDER_SITE_OTHER): Payer: Medicaid Other | Admitting: Obstetrics and Gynecology

## 2019-06-06 VITALS — BP 106/84 | HR 88 | Ht 62.0 in | Wt 195.0 lb

## 2019-06-06 DIAGNOSIS — Z3491 Encounter for supervision of normal pregnancy, unspecified, first trimester: Secondary | ICD-10-CM

## 2019-06-06 NOTE — Patient Instructions (Signed)
First Trimester of Pregnancy ° °The first trimester of pregnancy is from week 1 until the end of week 13 (months 1 through 3). During this time, your baby will begin to develop inside you. At 6-8 weeks, the eyes and face are formed, and the heartbeat can be seen on ultrasound. At the end of 12 weeks, all the baby's organs are formed. Prenatal care is all the medical care you receive before the birth of your baby. Make sure you get good prenatal care and follow all of your doctor's instructions. °Follow these instructions at home: °Medicines °· Take over-the-counter and prescription medicines only as told by your doctor. Some medicines are safe and some medicines are not safe during pregnancy. °· Take a prenatal vitamin that contains at least 600 micrograms (mcg) of folic acid. °· If you have trouble pooping (constipation), take medicine that will make your stool soft (stool softener) if your doctor approves. °Eating and drinking ° °· Eat regular, healthy meals. °· Your doctor will tell you the amount of weight gain that is right for you. °· Avoid raw meat and uncooked cheese. °· If you feel sick to your stomach (nauseous) or throw up (vomit): °? Eat 4 or 5 small meals a day instead of 3 large meals. °? Try eating a few soda crackers. °? Drink liquids between meals instead of during meals. °· To prevent constipation: °? Eat foods that are high in fiber, like fresh fruits and vegetables, whole grains, and beans. °? Drink enough fluids to keep your pee (urine) clear or pale yellow. °Activity °· Exercise only as told by your doctor. Stop exercising if you have cramps or pain in your lower belly (abdomen) or low back. °· Do not exercise if it is too hot, too humid, or if you are in a place of great height (high altitude). °· Try to avoid standing for long periods of time. Move your legs often if you must stand in one place for a long time. °· Avoid heavy lifting. °· Wear low-heeled shoes. Sit and stand up  straight. °· You can have sex unless your doctor tells you not to. °Relieving pain and discomfort °· Wear a good support bra if your breasts are sore. °· Take warm water baths (sitz baths) to soothe pain or discomfort caused by hemorrhoids. Use hemorrhoid cream if your doctor says it is okay. °· Rest with your legs raised if you have leg cramps or low back pain. °· If you have puffy, bulging veins (varicose veins) in your legs: °? Wear support hose or compression stockings as told by your doctor. °? Raise (elevate) your feet for 15 minutes, 3-4 times a day. °? Limit salt in your food. °Prenatal care °· Schedule your prenatal visits by the twelfth week of pregnancy. °· Write down your questions. Take them to your prenatal visits. °· Keep all your prenatal visits as told by your doctor. This is important. °Safety °· Wear your seat belt at all times when driving. °· Make a list of emergency phone numbers. The list should include numbers for family, friends, the hospital, and police and fire departments. °General instructions °· Ask your doctor for a referral to a local prenatal class. Begin classes no later than at the start of month 6 of your pregnancy. °· Ask for help if you need counseling or if you need help with nutrition. Your doctor can give you advice or tell you where to go for help. °· Do not use hot tubs, steam   rooms, or saunas. °· Do not douche or use tampons or scented sanitary pads. °· Do not cross your legs for long periods of time. °· Avoid all herbs and alcohol. Avoid drugs that are not approved by your doctor. °· Do not use any tobacco products, including cigarettes, chewing tobacco, and electronic cigarettes. If you need help quitting, ask your doctor. You may get counseling or other support to help you quit. °· Avoid cat litter boxes and soil used by cats. These carry germs that can cause birth defects in the baby and can cause a loss of your baby (miscarriage) or stillbirth. °· Visit your dentist.  At home, brush your teeth with a soft toothbrush. Be gentle when you floss. °Contact a doctor if: °· You are dizzy. °· You have mild cramps or pressure in your lower belly. °· You have a nagging pain in your belly area. °· You continue to feel sick to your stomach, you throw up, or you have watery poop (diarrhea). °· You have a bad smelling fluid coming from your vagina. °· You have pain when you pee (urinate). °· You have increased puffiness (swelling) in your face, hands, legs, or ankles. °Get help right away if: °· You have a fever. °· You are leaking fluid from your vagina. °· You have spotting or bleeding from your vagina. °· You have very bad belly cramping or pain. °· You gain or lose weight rapidly. °· You throw up blood. It may look like coffee grounds. °· You are around people who have German measles, fifth disease, or chickenpox. °· You have a very bad headache. °· You have shortness of breath. °· You have any kind of trauma, such as from a fall or a car accident. °Summary °· The first trimester of pregnancy is from week 1 until the end of week 13 (months 1 through 3). °· To take care of yourself and your unborn baby, you will need to eat healthy meals, take medicines only if your doctor tells you to do so, and do activities that are safe for you and your baby. °· Keep all follow-up visits as told by your doctor. This is important as your doctor will have to ensure that your baby is healthy and growing well. °This information is not intended to replace advice given to you by your health care provider. Make sure you discuss any questions you have with your health care provider. °Document Released: 02/04/2008 Document Revised: 12/09/2018 Document Reviewed: 08/26/2016 °Elsevier Patient Education © 2020 Elsevier Inc. ° °

## 2019-06-06 NOTE — Progress Notes (Signed)
I have reviewed the record and concur with patient management and plan.  Izabellah Dadisman, MD [facility]     

## 2019-06-06 NOTE — Progress Notes (Signed)
Alinda Deem presents for NOB nurse interview visit. Pregnancy confirmation done here at [facility].  G- .6  P- 2   . Pregnancy education material explained and given. No cats in the home. NOB labs ordered., (sickle cell). HIV labs and Drug screen were explained optional and she did not decline. Drug screen ordered/declined. PNV encouraged. Genetic screening options discussed. Genetic testing: Ordered/. We discussed financial policy, FMLA paperwork.  Pt already has appt with Dr. Marcelline Mates.

## 2019-06-07 LAB — URINALYSIS, ROUTINE W REFLEX MICROSCOPIC
Bilirubin, UA: NEGATIVE
Glucose, UA: NEGATIVE
Ketones, UA: NEGATIVE
Leukocytes,UA: NEGATIVE
Nitrite, UA: NEGATIVE
Protein,UA: NEGATIVE
RBC, UA: NEGATIVE
Specific Gravity, UA: 1.018 (ref 1.005–1.030)
Urobilinogen, Ur: 0.2 mg/dL (ref 0.2–1.0)
pH, UA: 5.5 (ref 5.0–7.5)

## 2019-06-07 LAB — GC/CHLAMYDIA PROBE AMP
Chlamydia trachomatis, NAA: NEGATIVE
Neisseria Gonorrhoeae by PCR: NEGATIVE

## 2019-06-08 LAB — ANTIBODY SCREEN: Antibody Screen: NEGATIVE

## 2019-06-08 LAB — HGB SOLU + RFLX FRAC: Sickle Solubility Test - HGBRFX: POSITIVE — AB

## 2019-06-08 LAB — VARICELLA ZOSTER ANTIBODY, IGG: Varicella zoster IgG: >4000 index (ref >165)

## 2019-06-08 LAB — HEMOGLOBIN FRAC.W/O SOLUBILITY
HGB C: 0 %
HGB S: 33.1 % — ABNORMAL HIGH
HGB VARIANT: 0 %
Hemoglobin A2 Quantitation: 4.1 % — ABNORMAL HIGH (ref 1.8–3.2)
Hemoglobin F Quantitation: 0 % (ref 0.0–2.0)
Hgb A: 62.8 % — ABNORMAL LOW (ref 96.4–98.8)

## 2019-06-08 LAB — ABO AND RH: Rh Factor: POSITIVE

## 2019-06-08 LAB — URINE CULTURE

## 2019-06-08 LAB — HIV ANTIBODY (ROUTINE TESTING W REFLEX): HIV Screen 4th Generation wRfx: NONREACTIVE

## 2019-06-08 LAB — HEPATITIS B SURFACE ANTIGEN: Hepatitis B Surface Ag: NEGATIVE

## 2019-06-08 LAB — RUBELLA SCREEN: Rubella Antibodies, IGG: 15.4 index (ref >0.99)

## 2019-06-08 LAB — RPR: RPR Ser Ql: NONREACTIVE

## 2019-06-09 ENCOUNTER — Encounter: Payer: Self-pay | Admitting: Obstetrics and Gynecology

## 2019-06-11 LAB — MONITOR DRUG PROFILE 14(MW)
Amphetamine Scrn, Ur: NEGATIVE ng/mL
BARBITURATE SCREEN URINE: NEGATIVE ng/mL
BENZODIAZEPINE SCREEN, URINE: NEGATIVE ng/mL
Buprenorphine, Urine: NEGATIVE ng/mL
Cocaine (Metab) Scrn, Ur: NEGATIVE ng/mL
Creatinine(Crt), U: 135.2 mg/dL (ref 20.0–300.0)
Fentanyl, Urine: NEGATIVE pg/mL
Meperidine Screen, Urine: NEGATIVE ng/mL
Methadone Screen, Urine: NEGATIVE ng/mL
OXYCODONE+OXYMORPHONE UR QL SCN: NEGATIVE ng/mL
Opiate Scrn, Ur: NEGATIVE ng/mL
Ph of Urine: 5.5 (ref 4.5–8.9)
Phencyclidine Qn, Ur: NEGATIVE ng/mL
Propoxyphene Scrn, Ur: NEGATIVE ng/mL
SPECIFIC GRAVITY: 1.017
Tramadol Screen, Urine: NEGATIVE ng/mL

## 2019-06-11 LAB — CANNABINOID (GC/MS), URINE
Cannabinoid: POSITIVE — AB
Carboxy THC (GC/MS): >300 ng/mL

## 2019-06-20 NOTE — Patient Instructions (Signed)
Second Trimester of Pregnancy  The second trimester is from week 14 through week 27 (month 4 through 6). This is often the time in pregnancy that you feel your best. Often times, morning sickness has lessened or quit. You may have more energy, and you may get hungry more often. Your unborn baby is growing rapidly. At the end of the sixth month, he or she is about 9 inches long and weighs about 1 pounds. You will likely feel the baby move between 18 and 20 weeks of pregnancy. Follow these instructions at home: Medicines  Take over-the-counter and prescription medicines only as told by your doctor. Some medicines are safe and some medicines are not safe during pregnancy.  Take a prenatal vitamin that contains at least 600 micrograms (mcg) of folic acid.  If you have trouble pooping (constipation), take medicine that will make your stool soft (stool softener) if your doctor approves. Eating and drinking   Eat regular, healthy meals.  Avoid raw meat and uncooked cheese.  If you get low calcium from the food you eat, talk to your doctor about taking a daily calcium supplement.  Avoid foods that are high in fat and sugars, such as fried and sweet foods.  If you feel sick to your stomach (nauseous) or throw up (vomit): ? Eat 4 or 5 small meals a day instead of 3 large meals. ? Try eating a few soda crackers. ? Drink liquids between meals instead of during meals.  To prevent constipation: ? Eat foods that are high in fiber, like fresh fruits and vegetables, whole grains, and beans. ? Drink enough fluids to keep your pee (urine) clear or pale yellow. Activity  Exercise only as told by your doctor. Stop exercising if you start to have cramps.  Do not exercise if it is too hot, too humid, or if you are in a place of great height (high altitude).  Avoid heavy lifting.  Wear low-heeled shoes. Sit and stand up straight.  You can continue to have sex unless your doctor tells you not to.  Relieving pain and discomfort  Wear a good support bra if your breasts are tender.  Take warm water baths (sitz baths) to soothe pain or discomfort caused by hemorrhoids. Use hemorrhoid cream if your doctor approves.  Rest with your legs raised if you have leg cramps or low back pain.  If you develop puffy, bulging veins (varicose veins) in your legs: ? Wear support hose or compression stockings as told by your doctor. ? Raise (elevate) your feet for 15 minutes, 3-4 times a day. ? Limit salt in your food. Prenatal care  Write down your questions. Take them to your prenatal visits.  Keep all your prenatal visits as told by your doctor. This is important. Safety  Wear your seat belt when driving.  Make a list of emergency phone numbers, including numbers for family, friends, the hospital, and police and fire departments. General instructions  Ask your doctor about the right foods to eat or for help finding a counselor, if you need these services.  Ask your doctor about local prenatal classes. Begin classes before month 6 of your pregnancy.  Do not use hot tubs, steam rooms, or saunas.  Do not douche or use tampons or scented sanitary pads.  Do not cross your legs for long periods of time.  Visit your dentist if you have not done so. Use a soft toothbrush to brush your teeth. Floss gently.  Avoid all smoking, herbs,  and alcohol. Avoid drugs that are not approved by your doctor.  Do not use any products that contain nicotine or tobacco, such as cigarettes and e-cigarettes. If you need help quitting, ask your doctor.  Avoid cat litter boxes and soil used by cats. These carry germs that can cause birth defects in the baby and can cause a loss of your baby (miscarriage) or stillbirth. Contact a doctor if:  You have mild cramps or pressure in your lower belly.  You have pain when you pee (urinate).  You have bad smelling fluid coming from your vagina.  You continue to feel  sick to your stomach (nauseous), throw up (vomit), or have watery poop (diarrhea).  You have a nagging pain in your belly area.  You feel dizzy. Get help right away if:  You have a fever.  You are leaking fluid from your vagina.  You have spotting or bleeding from your vagina.  You have severe belly cramping or pain.  You lose or gain weight rapidly.  You have trouble catching your breath and have chest pain.  You notice sudden or extreme puffiness (swelling) of your face, hands, ankles, feet, or legs.  You have not felt the baby move in over an hour.  You have severe headaches that do not go away when you take medicine.  You have trouble seeing. Summary  The second trimester is from week 14 through week 27 (months 4 through 6). This is often the time in pregnancy that you feel your best.  To take care of yourself and your unborn baby, you will need to eat healthy meals, take medicines only if your doctor tells you to do so, and do activities that are safe for you and your baby.  Call your doctor if you get sick or if you notice anything unusual about your pregnancy. Also, call your doctor if you need help with the right food to eat, or if you want to know what activities are safe for you. This information is not intended to replace advice given to you by your health care provider. Make sure you discuss any questions you have with your health care provider. Document Released: 11/12/2009 Document Revised: 12/10/2018 Document Reviewed: 09/23/2016 Elsevier Patient Education  [place]. Common Medications Safe in Pregnancy  Acne:      Constipation:  Benzoyl Peroxide     Colace  Clindamycin      Dulcolax Suppository  Topica Erythromycin     Fibercon  Salicylic Acid      Metamucil         Miralax AVOID:        Senakot   Accutane    Cough:  Retin-A       Cough Drops  Tetracycline      Phenergan w/ Codeine if Rx  Minocycline      Robitussin (Plain & DM)   Antibiotics:     Crabs/Lice:  Ceclor       RID  Cephalosporins    AVOID:  E-Mycins      Kwell  Keflex  Macrobid/Macrodantin   Diarrhea:  Penicillin      Kao-Pectate  Zithromax      Imodium AD         PUSH FLUIDS AVOID:       Cipro     Fever:  Tetracycline      Tylenol (Regular or Extra  Minocycline       Strength)  Levaquin      Extra Strength-Do  not          Exceed 8 tabs/24 hrs Caffeine:        <252m/day (equiv. To 1 cup of coffee or  approx. 3 12 oz sodas)         Gas: Cold/Hayfever:       Gas-X  Benadryl      Mylicon  Claritin       Phazyme  **Claritin-D        Chlor-Trimeton    Headaches:  Dimetapp      ASA-Free Excedrin  Drixoral-Non-Drowsy     Cold Compress  Mucinex (Guaifenasin)     Tylenol (Regular or Extra  Sudafed/Sudafed-12 Hour     Strength)  **Sudafed PE Pseudoephedrine   Tylenol Cold & Sinus     Vicks Vapor Rub  Zyrtec  **AVOID if Problems With Blood Pressure         Heartburn: Avoid lying down for at least 1 hour after meals  Aciphex      Maalox     Rash:  Milk of Magnesia     Benadryl    Mylanta       1% Hydrocortisone Cream  Pepcid  Pepcid Complete   Sleep Aids:  Prevacid      Ambien   Prilosec       Benadryl  Rolaids       Chamomile Tea  Tums (Limit 4/day)     Unisom  Zantac       Tylenol PM         Warm milk-add vanilla or  Hemorrhoids:       Sugar for taste  Anusol/Anusol H.C.  (RX: Analapram 2.5%)  Sugar Substitutes:  Hydrocortisone OTC     Ok in moderation  Preparation H      Tucks        Vaseline lotion applied to tissue with wiping    Herpes:     Throat:  Acyclovir      Oragel  Famvir  Valtrex     Vaccines:         Flu Shot Leg Cramps:       *Gardasil  Benadryl      Hepatitis A         Hepatitis B Nasal Spray:       Pneumovax  Saline Nasal Spray     Polio Booster         Tetanus Nausea:       Tuberculosis test or PPD  Vitamin B6 25 mg TID   AVOID:    Dramamine      *Gardasil  Emetrol       Live Poliovirus  Ginger  Root 250 mg QID    MMR (measles, mumps &  High Complex Carbs @ Bedtime    rebella)  Sea Bands-Accupressure    Varicella (Chickenpox)  Unisom 1/2 tab TID     *No known complications           If received before Pain:         Known pregnancy;   Darvocet       Resume series after  Lortab        Delivery  Percocet    Yeast:   Tramadol      Femstat  Tylenol 3      Gyne-lotrimin  Ultram       Monistat  Vicodin           MISC:         All Sunscreens  Hair Coloring/highlights          Insect Repellant's          (Including DEET)         Mystic Tans  

## 2019-06-20 NOTE — Progress Notes (Signed)
Pt is present for OB PE. Pt is currently [redacted]w[redacted]d. Pt stated having sharp pains in her left breast. Flu vaccine given.

## 2019-06-21 ENCOUNTER — Encounter: Payer: Self-pay | Admitting: Obstetrics and Gynecology

## 2019-06-21 ENCOUNTER — Other Ambulatory Visit: Payer: Medicaid Other

## 2019-06-21 ENCOUNTER — Ambulatory Visit (INDEPENDENT_AMBULATORY_CARE_PROVIDER_SITE_OTHER): Payer: Medicaid Other | Admitting: Obstetrics and Gynecology

## 2019-06-21 ENCOUNTER — Other Ambulatory Visit (HOSPITAL_COMMUNITY)
Admission: RE | Admit: 2019-06-21 | Discharge: 2019-06-21 | Disposition: A | Discharge status: Home / Self Care | Payer: 59 | Source: Ambulatory Visit | Attending: Obstetrics and Gynecology | Admitting: Obstetrics and Gynecology

## 2019-06-21 ENCOUNTER — Other Ambulatory Visit: Payer: Self-pay

## 2019-06-21 VITALS — BP 95/63 | HR 64 | Wt 193.1 lb

## 2019-06-21 DIAGNOSIS — R638 Other symptoms and signs concerning food and fluid intake: Secondary | ICD-10-CM

## 2019-06-21 DIAGNOSIS — Z23 Encounter for immunization: Secondary | ICD-10-CM

## 2019-06-21 DIAGNOSIS — Z3482 Encounter for supervision of other normal pregnancy, second trimester: Secondary | ICD-10-CM | POA: Diagnosis not present

## 2019-06-21 DIAGNOSIS — O99211 Obesity complicating pregnancy, first trimester: Secondary | ICD-10-CM | POA: Diagnosis not present

## 2019-06-21 DIAGNOSIS — O99019 Anemia complicating pregnancy, unspecified trimester: Secondary | ICD-10-CM | POA: Insufficient documentation

## 2019-06-21 DIAGNOSIS — F1290 Cannabis use, unspecified, uncomplicated: Secondary | ICD-10-CM

## 2019-06-21 DIAGNOSIS — D573 Sickle-cell trait: Secondary | ICD-10-CM | POA: Insufficient documentation

## 2019-06-21 DIAGNOSIS — O99011 Anemia complicating pregnancy, first trimester: Secondary | ICD-10-CM | POA: Diagnosis not present

## 2019-06-21 DIAGNOSIS — Z3A13 13 weeks gestation of pregnancy: Secondary | ICD-10-CM

## 2019-06-21 DIAGNOSIS — Z124 Encounter for screening for malignant neoplasm of cervix: Secondary | ICD-10-CM

## 2019-06-21 DIAGNOSIS — O99321 Drug use complicating pregnancy, first trimester: Secondary | ICD-10-CM | POA: Diagnosis not present

## 2019-06-21 LAB — POCT URINALYSIS DIPSTICK OB
Bilirubin, UA: NEGATIVE
Blood, UA: NEGATIVE
Glucose, UA: NEGATIVE
Ketones, UA: NEGATIVE
Leukocytes, UA: NEGATIVE
Nitrite, UA: NEGATIVE
POC,PROTEIN,UA: NEGATIVE
Spec Grav, UA: 1.015 (ref 1.010–1.025)
Urobilinogen, UA: 0.2 E.U./dL
pH, UA: 6.5 (ref 5.0–8.0)

## 2019-06-21 NOTE — Progress Notes (Signed)
OBSTETRIC INITIAL PRENATAL VISIT  Subjective:    Terri Bell is being seen today for her first obstetrical visit.  This is a planned pregnancy. She is a 33 y.o. [id number] female at [redacted]w[redacted]d gestation, Estimated Date of Delivery: 12/25/19 with unsure LMP, dated with 8 week sono. Her obstetrical history is significant for obesity and sickle cell trait (FOB unknown status). Relationship with FOB: significant other, living together. Patient does intend to breast feed. Pregnancy history fully reviewed.    OB History  Gravida Para Term Preterm AB Living  7 2 2  0 2 3  SAB TAB Ectopic Multiple Live Births  2 0 0 0 3    # Outcome Date GA Lbr Len/2nd Weight Sex Delivery Anes PTL Lv  7 Current           6 Term 2017    M Vag-Spont   LIV  5 SAB 08/2015        FD  4 Term 01/02/15 [redacted]w[redacted]d 09:03 / 00:19 8 lb 4.3 oz (3.751 kg) M Vag-Spont None  LIV     Birth Comments: none     Apgar1: 8  Apgar5: 9  3 SAB 10/2006        FD  2 Gravida 07/26/06   8 lb 5 oz (3.771 kg) M Vag-Spont EPI  LIV  1 Gravida             Obstetric Comments  08/2015 pt thinks she had miscarriage, not seen by physcian.    Gynecologic History:  Last pap smear was 10/2015.  Results were normal. H/o abnormal pap smear x 1 with ASCUS.  Reports history of STIs: Trichomoniasis in 2016.   Contraception: Previously on Nexplanon prior to pregnancy.    Past Medical History:  Diagnosis Date  . Increased BMI   . Mastitis   . Sickle cell trait ([place]) increased bmi  . Vaginal Pap smear, abnormal 08/02/2014   ascus/pos hpv- normal colpo; repeat 3 months pp    Family History  Problem Relation Age of Onset  . Thyroid disease Paternal Aunt   . Healthy Mother   . Healthy Father   . Diabetes Neg Hx   . Heart disease Neg Hx   . Breast cancer Neg Hx   . Ovarian cancer Neg Hx   . Colon cancer Neg Hx     Past Surgical History:  Procedure Laterality Date  . NO PAST SURGERIES      Social History   Socioeconomic History  . Marital  status: Single    Spouse name: Not on file  . Number of children: Not on file  . Years of education: Not on file  . Highest education level: Not on file  Occupational History  . Not on file  Social Needs  . Financial resource strain: Not on file  . Food insecurity    Worry: Not on file    Inability: Not on file  . Transportation needs    Medical: Not on file    Non-medical: Not on file  Tobacco Use  . Smoking status: Former Research scientist (life sciences)  . Smokeless tobacco: Never Used  Substance and Sexual Activity  . Alcohol use: Not Currently  . Drug use: No  . Sexual activity: Yes    Birth control/protection: None  Lifestyle  . Physical activity    Days per week: Not on file    Minutes per session: Not on file  . Stress: Not on file  Relationships  . Social connections  Talks on phone: Not on file    Gets together: Not on file    Attends religious service: Not on file    Active member of club or organization: Not on file    Attends meetings of clubs or organizations: Not on file    Relationship status: Not on file  . Intimate partner violence    Fear of current or ex partner: Not on file    Emotionally abused: Not on file    Physically abused: Not on file    Forced sexual activity: Not on file  Other Topics Concern  . Not on file  Social History Narrative  . Not on file    Current Outpatient Medications on File Prior to Visit  Medication Sig Dispense Refill  . Prenatal Vit-Fe Fumarate-FA (PRENATAL MULTIVITAMIN) TABS tablet Take 1 tablet by mouth daily at 12 noon.     No current facility-administered medications on file prior to visit.     Allergies  Allergen Reactions  . Amoxicillin Hives  . Penicillins Hives     Review of Systems General: Not Present- Fever, Weight Loss and Weight Gain. Skin: Not Present- Rash. HEENT: Not Present- Blurred Vision, Headache and Bleeding Gums. Respiratory: Not Present- Difficulty Breathing. Breast: Not Present- Breast Mass.  Cardiovascular: Not Present- Chest Pain, Elevated Blood Pressure, Fainting / Blacking Out and Shortness of Breath. Gastrointestinal: Not Present- Abdominal Pain, Constipation, Nausea and Vomiting. Female Genitourinary: Not Present- Frequency, Painful Urination, Pelvic Pain, Vaginal Bleeding, Vaginal Discharge, Contractions, regular, Fetal Movements Decreased, Urinary Complaints and Vaginal Fluid. Musculoskeletal: Not Present- Back Pain and Leg Cramps. Neurological: Not Present- Dizziness. Psychiatric: Not Present- Depression.     Objective:   Blood pressure 95/63, pulse 64, weight 193 lb 1.6 oz (87.6 kg), last menstrual period 03/20/2019.  Body mass index is 35.32 kg/m.  General Appearance:    Alert, cooperative, no distress, appears stated age, moderate obesity  Head:    Normocephalic, without obvious abnormality, atraumatic  Eyes:    PERRL, conjunctiva/corneas clear, EOM's intact, both eyes  Ears:    Normal external ear canals, both ears  Nose:   Nares normal, septum midline, mucosa normal, no drainage or sinus tenderness  Throat:   Lips, mucosa, and tongue normal; teeth and gums normal  Neck:   Supple, symmetrical, trachea midline, no adenopathy; thyroid: no enlargement/tenderness/nodules; no carotid bruit or JVD  Back:     Symmetric, no curvature, ROM normal, no CVA tenderness  Lungs:     Clear to auscultation bilaterally, respirations unlabored  Chest Wall:    No tenderness or deformity   Heart:    Regular rate and rhythm, S1 and S2 normal, no murmur, rub or gallop  Breast Exam:    No tenderness, masses, or nipple abnormality  Abdomen:     Soft, non-tender, bowel sounds active all four quadrants, no masses, no organomegaly.  FH 13 cm.  FHT 164 bpm.  Genitalia:    Pelvic:external genitalia normal, vagina with scant thin white discharge, no lesions, discharge, or tenderness, rectovaginal septum  normal. Cervix normal in appearance, no cervical motion tenderness, no adnexal masses or  tenderness.  Pregnancy positive findings: uterine enlargement: 13 wk size, nontender.   Rectal:    Normal external sphincter.  No hemorrhoids appreciated. Internal exam not done.   Extremities:   Extremities normal, atraumatic, no cyanosis or edema  Pulses:   2+ and symmetric all extremities  Skin:   Skin color, texture, turgor normal, no rashes or lesions  Lymph nodes:  Cervical, supraclavicular, and axillary nodes normal  Neurologic:   CNII-XII intact, normal strength, sensation and reflexes throughout      Assessment:    Pregnancy at 13 and 2/7 weeks   Increased BMI (moderate obesity) Marijuana use in pregnancy  Plan:    Initial labs reviewed. Pap smear performed.  Prenatal vitamins encouraged. Problem list reviewed and updated. Flu vaccine given today.  New OB counseling:  The patient has been given an overview regarding routine prenatal care.  Recommendations regarding diet, weight gain, and exercise in pregnancy were given. Prenatal testing, optional genetic testing, and ultrasound use in pregnancy were reviewed. Patient notes that she thought she had genetic testing done already. No results noted in Epic. Offered to test today, patient notes that she will return this afternoon (lab closed already for lunch). Will perform [id number].  Benefits of Breast Feeding were discussed. The patient is encouraged to consider nursing her baby post partum. Discussion had on marijuana cessation during pregnancy.  Sickle cell trait, same FOB as last pregnancy, declined testing. Will need to test after delivery.  Follow up in 4 weeks.   The patient has Medicaid.  [facility] Medicaid Risk Screening Form completed today  50% of 30 min visit spent on counseling and coordination of care.    Hildred Laserherry, Kiam Bransfield, MD [facility]

## 2019-06-22 LAB — CBC
Hematocrit: 33.7 % — ABNORMAL LOW (ref 34.0–46.6)
Hemoglobin: 11.7 g/dL (ref 11.1–15.9)
MCH: 27.5 pg (ref 26.6–33.0)
MCHC: 34.7 g/dL (ref 31.5–35.7)
MCV: 79 fL (ref 79–97)
Platelets: 316 10*3/uL (ref 150–450)
RBC: 4.26 x10E6/uL (ref 3.77–5.28)
RDW: 15.3 % (ref 11.7–15.4)
WBC: 6.6 10*3/uL (ref 3.4–10.8)

## 2019-06-27 LAB — CYTOLOGY - PAP
Comment: NEGATIVE
Diagnosis: NEGATIVE
High risk HPV: NEGATIVE

## 2019-06-28 ENCOUNTER — Telehealth: Payer: Self-pay

## 2019-06-28 NOTE — Telephone Encounter (Signed)
Pt returned call for lab results. Please call pt back [phone].

## 2019-06-28 NOTE — Telephone Encounter (Signed)
Pt is aware of test results.  

## 2019-06-30 ENCOUNTER — Telehealth: Payer: Self-pay

## 2019-06-30 NOTE — Telephone Encounter (Signed)
Pt called states she is unsure which over the counter version to purchase, one day treatment or five day treatment or other versions over the counter. Pt states she does not want to over medicate and prefers prescription. Please advise if a script can be sent to her pharmacy.

## 2019-07-01 MED ORDER — MICONAZOLE NITRATE 1200 & 2 MG & % VA KIT: 1.0000 | PACK | Freq: Once | VAGINAL | 0 refills | Status: DC

## 2019-07-01 NOTE — Telephone Encounter (Signed)
Pt called no answer LM via VM that a prescription for Monistat was sent to her pharmacy [facility] in [place].

## 2019-07-13 ENCOUNTER — Telehealth: Payer: Self-pay

## 2019-07-13 NOTE — Telephone Encounter (Signed)
Pt called no answer LM via VM to call the office to go over genetic testing. Mychart message also sent to pt.

## 2019-07-20 ENCOUNTER — Encounter: Payer: Medicaid Other | Admitting: Obstetrics and Gynecology

## 2019-07-25 ENCOUNTER — Telehealth: Payer: Self-pay

## 2019-07-25 NOTE — Telephone Encounter (Signed)
Spoke with concerning genetic testing results and to remind pt that her envelope was still at the front desk for pick up. Pt stated that she missed her last visit and will pick it up tomorrow during her visit.

## 2019-07-26 ENCOUNTER — Encounter: Payer: Medicaid Other | Admitting: Obstetrics and Gynecology

## 2019-07-26 ENCOUNTER — Encounter: Payer: Self-pay | Admitting: Obstetrics and Gynecology

## 2019-08-10 ENCOUNTER — Ambulatory Visit (INDEPENDENT_AMBULATORY_CARE_PROVIDER_SITE_OTHER): Payer: Medicaid Other | Admitting: Obstetrics and Gynecology

## 2019-08-10 ENCOUNTER — Other Ambulatory Visit: Payer: Self-pay

## 2019-08-10 ENCOUNTER — Encounter: Payer: Self-pay | Admitting: Obstetrics and Gynecology

## 2019-08-10 VITALS — BP 104/72 | HR 80 | Wt 195.0 lb

## 2019-08-10 DIAGNOSIS — Z3A20 20 weeks gestation of pregnancy: Secondary | ICD-10-CM

## 2019-08-10 DIAGNOSIS — Z3482 Encounter for supervision of other normal pregnancy, second trimester: Secondary | ICD-10-CM

## 2019-08-10 LAB — POCT URINALYSIS DIPSTICK OB
Bilirubin, UA: NEGATIVE
Blood, UA: NEGATIVE
Glucose, UA: NEGATIVE
Ketones, UA: NEGATIVE
Leukocytes, UA: NEGATIVE
Nitrite, UA: NEGATIVE
Spec Grav, UA: 1.01 (ref 1.010–1.025)
Urobilinogen, UA: 0.2 E.U./dL
pH, UA: 6.5 (ref 5.0–8.0)

## 2019-08-10 NOTE — Progress Notes (Signed)
ROB: No issues.  We discussed missed appointments.  Scheduled FAS.  Weight gain discussed-will follow growth of the baby.

## 2019-08-10 NOTE — Progress Notes (Signed)
Patient comes in today for  ROB visit.  

## 2019-08-16 ENCOUNTER — Ambulatory Visit (INDEPENDENT_AMBULATORY_CARE_PROVIDER_SITE_OTHER): Payer: 59

## 2019-08-16 ENCOUNTER — Other Ambulatory Visit: Payer: Self-pay

## 2019-08-16 DIAGNOSIS — Z3482 Encounter for supervision of other normal pregnancy, second trimester: Secondary | ICD-10-CM | POA: Diagnosis not present

## 2019-09-02 NOTE — L&D Delivery Note (Signed)
       Delivery Note   Terri Bell is a 34 y.o. [id number] at [redacted]w[redacted]d Estimated Date of Delivery: 12/25/19  PRE-OPERATIVE DIAGNOSIS:  1) [redacted]w[redacted]d pregnancy.  2) Pt presented for induction for post dates but was in labor   POST-OPERATIVE DIAGNOSIS:  1) [redacted]w[redacted]d pregnancy s/p Vaginal, Spontaneous    Delivery Type: Vaginal, Spontaneous    Delivery Anesthesia: None   Labor Complications:      ESTIMATED BLOOD LOSS: 125 ml    FINDINGS:   1) female infant, Apgar scores of 7   at 1 minute and 9   at 5 minutes and a birthweight of 128.04  ounces.    2) Nuchal cord: Yes -X2 tight  SPECIMENS:   PLACENTA:   Appearance: Intact    Removal: Spontaneous      Disposition:    DISPOSITION:  Infant to left in stable condition in the delivery room, with L&D personnel and mother,  NARRATIVE SUMMARY: Labor course:  Terri Bell is a [id number] at [redacted]w[redacted]d who presented for induction of labor but was already in labor upon presentation.  She received the appropriate anesthesia and proceeded to complete dilation. She evidenced good maternal expulsive effort during the second stage. She went on to deliver a viable infant. The placenta delivered without problems and was noted to be complete. A perineal and vaginal examination was performed. Episiotomy/Lacerations: None  The patient tolerated this well.  Elonda Husky, M.D. 01/03/2020 12:18 PM

## 2019-09-08 ENCOUNTER — Encounter: Payer: Self-pay | Admitting: Obstetrics and Gynecology

## 2019-09-08 ENCOUNTER — Other Ambulatory Visit: Payer: Self-pay

## 2019-09-08 ENCOUNTER — Ambulatory Visit (INDEPENDENT_AMBULATORY_CARE_PROVIDER_SITE_OTHER): Payer: Medicaid Other | Admitting: Obstetrics and Gynecology

## 2019-09-08 VITALS — BP 99/55 | HR 66 | Wt 197.8 lb

## 2019-09-08 DIAGNOSIS — Z130 Encounter for screening for diseases of the blood and blood-forming organs and certain disorders involving the immune mechanism: Secondary | ICD-10-CM

## 2019-09-08 DIAGNOSIS — Z131 Encounter for screening for diabetes mellitus: Secondary | ICD-10-CM

## 2019-09-08 DIAGNOSIS — Z3482 Encounter for supervision of other normal pregnancy, second trimester: Secondary | ICD-10-CM

## 2019-09-08 DIAGNOSIS — G43009 Migraine without aura, not intractable, without status migrainosus: Secondary | ICD-10-CM

## 2019-09-08 DIAGNOSIS — O2612 Low weight gain in pregnancy, second trimester: Secondary | ICD-10-CM

## 2019-09-08 DIAGNOSIS — Z3A25 25 weeks gestation of pregnancy: Secondary | ICD-10-CM

## 2019-09-08 LAB — POCT URINALYSIS DIPSTICK OB
Bilirubin, UA: NEGATIVE
Blood, UA: NEGATIVE
Glucose, UA: NEGATIVE
Ketones, UA: NEGATIVE
Nitrite, UA: NEGATIVE
POC,PROTEIN,UA: NEGATIVE
Spec Grav, UA: 1.02 (ref 1.010–1.025)
Urobilinogen, UA: 0.2 E.U./dL
pH, UA: 6 (ref 5.0–8.0)

## 2019-09-08 NOTE — Progress Notes (Signed)
ROB: Complains of lower abdominal pain and pressure x 1 week. Also noted some swelling in the vaginal area. Discussed use of belly band. Patient concerned about lack of weight gain, despite eating well. FH wnl. Given reassurance, but advised on increasing protein intake.  Has steadily gained 2 lbs each visit for the last 3 visits, growth normal on recent anatomy scan.  Notes migraines, mostly occurring at work, not relieved by Tylenol. Thinks it may be due to the fluorescent lighting. Discussed relieving measures. RTC in 4 weeks. For glucola at that time.    Hildred Laser, MD [facility]

## 2019-09-08 NOTE — Progress Notes (Signed)
ROB-Pt present for routine prenatal care. Pt stated having pain and pressure in vaginal area. Pt stating having migraine off and on.

## 2019-09-20 ENCOUNTER — Other Ambulatory Visit: Payer: Self-pay

## 2019-09-20 ENCOUNTER — Encounter: Payer: Self-pay | Admitting: Obstetrics and Gynecology

## 2019-09-20 ENCOUNTER — Observation Stay
Admission: EM | Admit: 2019-09-20 | Discharge: 2019-09-20 | Disposition: A | Discharge status: Home / Self Care | Payer: 59 | Attending: Obstetrics and Gynecology | Admitting: Obstetrics and Gynecology

## 2019-09-20 DIAGNOSIS — Z3A27 27 weeks gestation of pregnancy: Secondary | ICD-10-CM | POA: Insufficient documentation

## 2019-09-20 DIAGNOSIS — R109 Unspecified abdominal pain: Secondary | ICD-10-CM | POA: Insufficient documentation

## 2019-09-20 DIAGNOSIS — O99891 Other specified diseases and conditions complicating pregnancy: Secondary | ICD-10-CM | POA: Diagnosis not present

## 2019-09-20 DIAGNOSIS — O26892 Other specified pregnancy related conditions, second trimester: Principal | ICD-10-CM | POA: Insufficient documentation

## 2019-09-20 DIAGNOSIS — R220 Localized swelling, mass and lump, head: Secondary | ICD-10-CM | POA: Insufficient documentation

## 2019-09-20 DIAGNOSIS — M79606 Pain in leg, unspecified: Secondary | ICD-10-CM | POA: Diagnosis not present

## 2019-09-20 DIAGNOSIS — R102 Pelvic and perineal pain: Secondary | ICD-10-CM | POA: Diagnosis present

## 2019-09-20 DIAGNOSIS — M25559 Pain in unspecified hip: Secondary | ICD-10-CM | POA: Insufficient documentation

## 2019-09-20 NOTE — Progress Notes (Signed)
Pt given discharge paperwork and labor precautions

## 2019-09-20 NOTE — OB Triage Note (Signed)
Patient states her hip/back and butt pain/pressure she has been having since the 11th has intensified for the past 3 days. Patient states she has pressure in her butt (having normal BMs) and stabbing pain in her hips and stabbing pain in her lower abdomen when baby moves. Pt rates pain 8/10. Pt also states her left arm and face was swollen when she woke up this morning but is back to normal now. Pt states baby is moving well. Denies any other complaints at this time.

## 2019-09-20 NOTE — Discharge Instructions (Signed)
Keep your next scheduled appointment. Call your provider in pain worsens if you have any other concerns. You may continue to take tylenol as needed and wear your abdominal support band.

## 2019-09-20 NOTE — Progress Notes (Signed)
Report called to Logan Bores, MD. Orders received to d/c home. Pt may continue to take Tylenol as need, continue to wear support band.

## 2019-09-27 NOTE — Discharge Summary (Signed)
    L&D OB Triage Note  SUBJECTIVE Terri Bell is a 34 y.o. [id number] female at [redacted]w[redacted]d, EDD Estimated Date of Delivery: 12/25/19 who presented to triage with complaints of leg pain hip pain abdominal pain transient facial swelling.  Denies true obstetric complaints including contractions or vaginal bleeding.  Reports baby moving normally.  Patient reports she has had all of these issues off and on since January 11 but over the last several days they have become worse.  She has an appointment to be [facility] tomorrow.  OB History  Gravida Para Term Preterm AB Living  7 2 2  0 2 3  SAB TAB Ectopic Multiple Live Births  2 0 0 0 3    # Outcome Date GA Lbr Len/2nd Weight Sex Delivery Anes PTL Lv  7 Current           6 Term 2017    M Vag-Spont   LIV  5 SAB 08/2015        FD  4 Term 01/02/15 [redacted]w[redacted]d 09:03 / 00:19 3751 g M Vag-Spont None  LIV     Birth Comments: none     Apgar1: 8  Apgar5: 9  3 SAB 10/2006        FD  2 Gravida 07/26/06   3771 g M Vag-Spont EPI  LIV  1 Gravida             Obstetric Comments  08/2015 pt thinks she had miscarriage, not seen by physcian.    No medications prior to admission.     OBJECTIVE  Nursing Evaluation:   BP 105/69 (BP Location: Left Arm)   Pulse 71   Resp 18   LMP  (LMP Unknown)    Findings:        No significant medical issues noted-question aches and pains of pregnancy?     No contractions-fetal wellbeing established.      NST was performed and has been reviewed by me.  NST INTERPRETATION: Category I  Mode: External Baseline Rate (A): 135 bpm Variability: Moderate Accelerations: 15 x 15 Decelerations: None     Contraction Frequency (min): none  ASSESSMENT Impression:  1.  Pregnancy:  09/2015 at [redacted]w[redacted]d , EDD Estimated Date of Delivery: 12/25/19 2.  Reassuring fetal and maternal status   PLAN 1. Discussed current condition and above findings with patient and reassurance given.  All questions answered. 2. Discharge home with  standard labor precautions given to return to L&D or call the office for problems. 3. Continue routine prenatal care.

## 2019-10-06 ENCOUNTER — Encounter: Payer: Self-pay | Admitting: Obstetrics and Gynecology

## 2019-10-06 ENCOUNTER — Ambulatory Visit (INDEPENDENT_AMBULATORY_CARE_PROVIDER_SITE_OTHER): Payer: 59 | Admitting: Obstetrics and Gynecology

## 2019-10-06 ENCOUNTER — Other Ambulatory Visit: Payer: Self-pay

## 2019-10-06 ENCOUNTER — Other Ambulatory Visit: Payer: 59

## 2019-10-06 VITALS — BP 103/71 | HR 90 | Wt 204.5 lb

## 2019-10-06 DIAGNOSIS — Z3A28 28 weeks gestation of pregnancy: Secondary | ICD-10-CM

## 2019-10-06 DIAGNOSIS — Z3482 Encounter for supervision of other normal pregnancy, second trimester: Secondary | ICD-10-CM

## 2019-10-06 DIAGNOSIS — Z3483 Encounter for supervision of other normal pregnancy, third trimester: Secondary | ICD-10-CM

## 2019-10-06 DIAGNOSIS — Z131 Encounter for screening for diabetes mellitus: Secondary | ICD-10-CM

## 2019-10-06 DIAGNOSIS — Z23 Encounter for immunization: Secondary | ICD-10-CM | POA: Diagnosis not present

## 2019-10-06 DIAGNOSIS — Z130 Encounter for screening for diseases of the blood and blood-forming organs and certain disorders involving the immune mechanism: Secondary | ICD-10-CM

## 2019-10-06 LAB — POCT URINALYSIS DIPSTICK OB
Bilirubin, UA: NEGATIVE
Blood, UA: NEGATIVE
Glucose, UA: NEGATIVE
Ketones, UA: NEGATIVE
Leukocytes, UA: NEGATIVE
Nitrite, UA: NEGATIVE
POC,PROTEIN,UA: NEGATIVE
Spec Grav, UA: 1.01 (ref 1.010–1.025)
Urobilinogen, UA: 0.2 E.U./dL
pH, UA: 6.5 (ref 5.0–8.0)

## 2019-10-06 NOTE — Progress Notes (Signed)
Patient comes in today for  ROB visit.  

## 2019-10-06 NOTE — Progress Notes (Signed)
ROB: Belly band is helping with pelvic pressure.  Reports baby is very active.  1 hour GCT today.

## 2019-10-06 NOTE — Addendum Note (Signed)
Addended by: Dorian Pod on: 10/06/2019 02:14 PM   Modules accepted: Orders

## 2019-10-07 LAB — CBC
Hematocrit: 30.5 % — ABNORMAL LOW (ref 34.0–46.6)
Hemoglobin: 10.6 g/dL — ABNORMAL LOW (ref 11.1–15.9)
MCH: 28.4 pg (ref 26.6–33.0)
MCHC: 34.8 g/dL (ref 31.5–35.7)
MCV: 82 fL (ref 79–97)
Platelets: 250 10*3/uL (ref 150–450)
RBC: 3.73 x10E6/uL — ABNORMAL LOW (ref 3.77–5.28)
RDW: 13.5 % (ref 11.7–15.4)
WBC: 9.4 10*3/uL (ref 3.4–10.8)

## 2019-10-07 LAB — GLUCOSE, 1 HOUR GESTATIONAL: Gestational Diabetes Screen: 65 mg/dL (ref 65–139)

## 2019-10-07 LAB — RPR: RPR Ser Ql: NONREACTIVE

## 2019-10-27 NOTE — Progress Notes (Signed)
ROB-Pt present for routine prenatal care. Pt c/o lower back pain/pelvic pain.

## 2019-10-28 ENCOUNTER — Ambulatory Visit (INDEPENDENT_AMBULATORY_CARE_PROVIDER_SITE_OTHER): Payer: Medicaid Other | Admitting: Obstetrics and Gynecology

## 2019-10-28 ENCOUNTER — Encounter: Payer: Self-pay | Admitting: Obstetrics and Gynecology

## 2019-10-28 ENCOUNTER — Other Ambulatory Visit: Payer: Self-pay

## 2019-10-28 VITALS — BP 97/60 | HR 82 | Wt 209.0 lb

## 2019-10-28 DIAGNOSIS — Z3483 Encounter for supervision of other normal pregnancy, third trimester: Secondary | ICD-10-CM

## 2019-10-28 DIAGNOSIS — M545 Low back pain: Secondary | ICD-10-CM

## 2019-10-28 DIAGNOSIS — Z3A31 31 weeks gestation of pregnancy: Secondary | ICD-10-CM

## 2019-10-28 LAB — POCT URINALYSIS DIPSTICK OB
Bilirubin, UA: NEGATIVE
Blood, UA: NEGATIVE
Glucose, UA: NEGATIVE
Ketones, UA: NEGATIVE
Nitrite, UA: NEGATIVE
POC,PROTEIN,UA: NEGATIVE
Spec Grav, UA: 1.01 (ref 1.010–1.025)
Urobilinogen, UA: 0.2 E.U./dL
pH, UA: 6.5 (ref 5.0–8.0)

## 2019-10-28 NOTE — Progress Notes (Signed)
ROB: Doing overall well. Is using belly band which is helping her back pain and pelvic pain. Normal glucola. Mildly anemic. Trying to increase iron in diet. Desires to breastfeed. Considering Depo Provera for contraception. Received Tdap, signed blood consent last visit. RTC in 2 weeks.

## 2019-11-11 ENCOUNTER — Ambulatory Visit (INDEPENDENT_AMBULATORY_CARE_PROVIDER_SITE_OTHER): Payer: Medicaid Other | Admitting: Obstetrics and Gynecology

## 2019-11-11 ENCOUNTER — Other Ambulatory Visit: Payer: Self-pay

## 2019-11-11 ENCOUNTER — Encounter: Payer: Self-pay | Admitting: Obstetrics and Gynecology

## 2019-11-11 VITALS — BP 103/70 | HR 87 | Wt 213.3 lb

## 2019-11-11 DIAGNOSIS — Z3483 Encounter for supervision of other normal pregnancy, third trimester: Secondary | ICD-10-CM

## 2019-11-11 DIAGNOSIS — Z3A36 36 weeks gestation of pregnancy: Secondary | ICD-10-CM

## 2019-11-11 LAB — POCT URINALYSIS DIPSTICK OB
Bilirubin, UA: NEGATIVE
Blood, UA: NEGATIVE
Glucose, UA: NEGATIVE
Ketones, UA: NEGATIVE
Leukocytes, UA: NEGATIVE
Nitrite, UA: NEGATIVE
POC,PROTEIN,UA: NEGATIVE
Spec Grav, UA: 1.01 (ref 1.010–1.025)
Urobilinogen, UA: 0.2 E.U./dL
pH, UA: 7.5 (ref 5.0–8.0)

## 2019-11-11 NOTE — Progress Notes (Signed)
ROB: No complaints.  Patient reports active movement.  Still using belly band successfully.

## 2019-11-25 ENCOUNTER — Ambulatory Visit (INDEPENDENT_AMBULATORY_CARE_PROVIDER_SITE_OTHER): Payer: Medicaid Other | Admitting: Obstetrics and Gynecology

## 2019-11-25 ENCOUNTER — Other Ambulatory Visit: Payer: Self-pay

## 2019-11-25 ENCOUNTER — Encounter: Payer: Self-pay | Admitting: Obstetrics and Gynecology

## 2019-11-25 VITALS — BP 114/74 | HR 87 | Wt 212.0 lb

## 2019-11-25 DIAGNOSIS — Z3685 Encounter for antenatal screening for Streptococcus B: Secondary | ICD-10-CM

## 2019-11-25 DIAGNOSIS — O23599 Infection of other part of genital tract in pregnancy, unspecified trimester: Secondary | ICD-10-CM

## 2019-11-25 DIAGNOSIS — N760 Acute vaginitis: Secondary | ICD-10-CM

## 2019-11-25 DIAGNOSIS — Z0289 Encounter for other administrative examinations: Secondary | ICD-10-CM

## 2019-11-25 DIAGNOSIS — Z3A35 35 weeks gestation of pregnancy: Secondary | ICD-10-CM

## 2019-11-25 DIAGNOSIS — Z3483 Encounter for supervision of other normal pregnancy, third trimester: Secondary | ICD-10-CM

## 2019-11-25 LAB — POCT URINALYSIS DIPSTICK OB
Bilirubin, UA: NEGATIVE
Blood, UA: NEGATIVE
Glucose, UA: NEGATIVE
Ketones, UA: NEGATIVE
Nitrite, UA: NEGATIVE
Spec Grav, UA: 1.01
Urobilinogen, UA: 0.2 U/dL
pH, UA: 7

## 2019-11-25 NOTE — Progress Notes (Signed)
Patient comes in today for ROB visit. She is having some burning in that vagina area. She thinks it may be her soap.

## 2019-11-25 NOTE — Patient Instructions (Signed)

## 2019-11-25 NOTE — Progress Notes (Signed)
ROB: Patient complains of vaginal burning.  Thinks it may be due to her soap. Has stopped using but still noticing irritation. Nuswab collected. GBS done. Also noting [employer]. Discussed labor precautions. Brought FMLA paperwork today. Discussed circumcision. Desires Depo Provera injection for contraception. RTC in 1 week.

## 2019-11-28 LAB — NUSWAB VAGINITIS PLUS (VG+)
Atopobium vaginae: HIGH Score — AB
Candida albicans, NAA: POSITIVE — AB
Candida glabrata, NAA: NEGATIVE
Chlamydia trachomatis, NAA: NEGATIVE
Megasphaera 1: HIGH Score — AB
Neisseria gonorrhoeae, NAA: NEGATIVE
Trich vag by NAA: POSITIVE — AB

## 2019-11-29 LAB — CULTURE, BETA STREP (GROUP B ONLY): Strep Gp B Culture: NEGATIVE

## 2019-11-30 ENCOUNTER — Other Ambulatory Visit: Payer: Self-pay

## 2019-11-30 MED ORDER — METRONIDAZOLE 500 MG PO TABS: 500.0000 mg | ORAL_TABLET | Freq: Two times a day (BID) | ORAL | 1 refills | Status: DC

## 2019-11-30 MED ORDER — FLUCONAZOLE 150 MG PO TABS: 150.0000 mg | ORAL_TABLET | Freq: Once | ORAL | 0 refills | Status: AC

## 2019-11-30 NOTE — Telephone Encounter (Signed)
Pt called and is aware of her test results and medication sent in to treat the infection.

## 2019-12-01 ENCOUNTER — Encounter: Payer: Self-pay | Admitting: Obstetrics and Gynecology

## 2019-12-01 ENCOUNTER — Other Ambulatory Visit: Payer: Self-pay

## 2019-12-01 ENCOUNTER — Ambulatory Visit (INDEPENDENT_AMBULATORY_CARE_PROVIDER_SITE_OTHER): Payer: Medicaid Other | Admitting: Obstetrics and Gynecology

## 2019-12-01 VITALS — BP 121/74 | HR 112 | Wt 214.0 lb

## 2019-12-01 DIAGNOSIS — Z3483 Encounter for supervision of other normal pregnancy, third trimester: Secondary | ICD-10-CM

## 2019-12-01 DIAGNOSIS — Z3A36 36 weeks gestation of pregnancy: Secondary | ICD-10-CM

## 2019-12-01 LAB — POCT URINALYSIS DIPSTICK OB
Bilirubin, UA: NEGATIVE
Blood, UA: NEGATIVE
Glucose, UA: NEGATIVE
Ketones, UA: NEGATIVE
Leukocytes, UA: NEGATIVE
Nitrite, UA: NEGATIVE
POC,PROTEIN,UA: NEGATIVE
Spec Grav, UA: 1.01 (ref 1.010–1.025)
Urobilinogen, UA: 0.2 E.U./dL
pH, UA: 6.5 (ref 5.0–8.0)

## 2019-12-01 NOTE — Progress Notes (Signed)
ROB: Patient has not started her medication yet for vaginitis/trichomonas.  Says that she will get the prescription today.  Denies contractions.  Reports daily fetal movement.  Signs and symptoms of labor discussed.

## 2019-12-07 ENCOUNTER — Encounter: Payer: Medicaid Other | Admitting: Obstetrics and Gynecology

## 2019-12-08 ENCOUNTER — Ambulatory Visit (INDEPENDENT_AMBULATORY_CARE_PROVIDER_SITE_OTHER): Payer: Medicaid Other | Admitting: Obstetrics and Gynecology

## 2019-12-08 ENCOUNTER — Telehealth: Payer: Self-pay | Admitting: Obstetrics and Gynecology

## 2019-12-08 ENCOUNTER — Encounter: Payer: Self-pay | Admitting: Obstetrics and Gynecology

## 2019-12-08 ENCOUNTER — Other Ambulatory Visit: Payer: Self-pay | Admitting: Obstetrics and Gynecology

## 2019-12-08 ENCOUNTER — Other Ambulatory Visit: Payer: Self-pay

## 2019-12-08 ENCOUNTER — Other Ambulatory Visit (HOSPITAL_COMMUNITY)
Admission: RE | Admit: 2019-12-08 | Discharge: 2019-12-08 | Disposition: A | Discharge status: Home / Self Care | Payer: Medicaid Other | Source: Ambulatory Visit | Attending: Obstetrics and Gynecology | Admitting: Obstetrics and Gynecology

## 2019-12-08 VITALS — BP 115/78 | HR 90 | Wt 212.9 lb

## 2019-12-08 DIAGNOSIS — Z3483 Encounter for supervision of other normal pregnancy, third trimester: Secondary | ICD-10-CM | POA: Insufficient documentation

## 2019-12-08 DIAGNOSIS — Z3A37 37 weeks gestation of pregnancy: Secondary | ICD-10-CM

## 2019-12-08 DIAGNOSIS — O99613 Diseases of the digestive system complicating pregnancy, third trimester: Secondary | ICD-10-CM

## 2019-12-08 DIAGNOSIS — O23593 Infection of other part of genital tract in pregnancy, third trimester: Secondary | ICD-10-CM

## 2019-12-08 DIAGNOSIS — A5901 Trichomonal vulvovaginitis: Secondary | ICD-10-CM

## 2019-12-08 DIAGNOSIS — K219 Gastro-esophageal reflux disease without esophagitis: Secondary | ICD-10-CM

## 2019-12-08 LAB — POCT URINALYSIS DIPSTICK OB
Bilirubin, UA: NEGATIVE
Blood, UA: NEGATIVE
Glucose, UA: NEGATIVE
Nitrite, UA: NEGATIVE
POC,PROTEIN,UA: NEGATIVE
Spec Grav, UA: 1.015 (ref 1.010–1.025)
Urobilinogen, UA: 0.2 E.U./dL
pH, UA: 6.5 (ref 5.0–8.0)

## 2019-12-08 NOTE — Progress Notes (Signed)
ROB: Notes pressure in the pelvic area but notes this is not a new concern.  Is wearing pregnancy girdle.  36 week labs done. Notes she just completed pills for recent trichomoniasis infection yesterday. Notes partner treatment as well. Will need TOC in ~ 2 weeks (likely at time of delivery). RTC in 1 week. Labor precautions discussed.

## 2019-12-08 NOTE — Patient Instructions (Signed)

## 2019-12-08 NOTE — Progress Notes (Signed)
ROB-Pt present for routine prenatal care and 36 weeks cultures. Pt c/o of pressure in the pelvic area.

## 2019-12-08 NOTE — Telephone Encounter (Signed)
I had an employee call from [facility] cytology, she just wanted to confirm that the order for a swab for this patient was indeed supposed to have been sent to them. Call back number is [phone]. Could you please advise?

## 2019-12-09 NOTE — Telephone Encounter (Signed)
Spoke with [facility],.they wanted to know if it was okay to set the labs to be viewed by patient mychart after results were completed. Informed them that it was okay to release the labs into mychart for pt to review.

## 2019-12-10 LAB — STREP GP B NAA+RFLX: Strep Gp B NAA+Rflx: NEGATIVE

## 2019-12-12 LAB — MOLECULAR ANCILLARY ONLY
Chlamydia: NEGATIVE
Comment: NEGATIVE
Comment: NORMAL
Neisseria Gonorrhea: NEGATIVE

## 2019-12-15 ENCOUNTER — Ambulatory Visit (INDEPENDENT_AMBULATORY_CARE_PROVIDER_SITE_OTHER): Payer: Medicaid Other | Admitting: Obstetrics and Gynecology

## 2019-12-15 ENCOUNTER — Encounter: Payer: Self-pay | Admitting: Obstetrics and Gynecology

## 2019-12-15 ENCOUNTER — Other Ambulatory Visit: Payer: Self-pay

## 2019-12-15 VITALS — BP 128/85 | HR 112 | Ht 62.0 in | Wt 216.5 lb

## 2019-12-15 DIAGNOSIS — Z3483 Encounter for supervision of other normal pregnancy, third trimester: Secondary | ICD-10-CM

## 2019-12-15 DIAGNOSIS — Z3A38 38 weeks gestation of pregnancy: Secondary | ICD-10-CM

## 2019-12-15 LAB — POCT URINALYSIS DIPSTICK OB
Bilirubin, UA: NEGATIVE
Blood, UA: NEGATIVE
Glucose, UA: NEGATIVE
Ketones, UA: NEGATIVE
Nitrite, UA: NEGATIVE
Spec Grav, UA: 1.01 (ref 1.010–1.025)
Urobilinogen, UA: 0.2 E.U./dL
pH, UA: 7 (ref 5.0–8.0)

## 2019-12-15 NOTE — Progress Notes (Signed)
ROB: No complaints.  Patient doing well.  Belly band working.  Denies contractions.

## 2019-12-21 ENCOUNTER — Encounter: Payer: Self-pay | Admitting: Obstetrics and Gynecology

## 2019-12-21 ENCOUNTER — Other Ambulatory Visit: Payer: Self-pay

## 2019-12-21 ENCOUNTER — Ambulatory Visit (INDEPENDENT_AMBULATORY_CARE_PROVIDER_SITE_OTHER): Payer: Medicaid Other | Admitting: Obstetrics and Gynecology

## 2019-12-21 VITALS — BP 108/72 | HR 91 | Wt 215.2 lb

## 2019-12-21 DIAGNOSIS — Z3483 Encounter for supervision of other normal pregnancy, third trimester: Secondary | ICD-10-CM | POA: Diagnosis not present

## 2019-12-21 DIAGNOSIS — Z8619 Personal history of other infectious and parasitic diseases: Secondary | ICD-10-CM

## 2019-12-21 DIAGNOSIS — Z3A39 39 weeks gestation of pregnancy: Secondary | ICD-10-CM

## 2019-12-21 DIAGNOSIS — O480 Post-term pregnancy: Secondary | ICD-10-CM

## 2019-12-21 LAB — POCT URINALYSIS DIPSTICK OB
Bilirubin, UA: NEGATIVE
Blood, UA: NEGATIVE
Glucose, UA: NEGATIVE
Ketones, UA: NEGATIVE
Leukocytes, UA: NEGATIVE
Nitrite, UA: NEGATIVE
POC,PROTEIN,UA: NEGATIVE
Spec Grav, UA: 1.015 (ref 1.010–1.025)
Urobilinogen, UA: 0.2 E.U./dL
pH, UA: 6 (ref 5.0–8.0)

## 2019-12-21 NOTE — Progress Notes (Addendum)
ROB: Patient still noting back pressure and vaginal pain. Given labor precautions.Wet prep TOC done for trichomoniasis, negative. Few clue cells, no yeast.   RTC in 1 week if undelivered. Will need growth/BPP next visit for postdates. To be scheduled for IOL by 41 weeks if undelivered.

## 2019-12-21 NOTE — Progress Notes (Signed)
ROB-Pt present for routine prenatal care. Pt c/o of a lot of back pain and a lot of vaginal pain.

## 2019-12-25 ENCOUNTER — Inpatient Hospital Stay: Admit: 2019-12-25 | Payer: Self-pay

## 2019-12-26 ENCOUNTER — Other Ambulatory Visit
Admission: RE | Admit: 2019-12-26 | Discharge: 2019-12-26 | Disposition: A | Discharge status: Home / Self Care | Payer: Medicaid Other | Source: Ambulatory Visit | Attending: Obstetrics and Gynecology | Admitting: Obstetrics and Gynecology

## 2019-12-26 ENCOUNTER — Other Ambulatory Visit: Payer: Self-pay

## 2019-12-26 DIAGNOSIS — Z01812 Encounter for preprocedural laboratory examination: Secondary | ICD-10-CM | POA: Diagnosis present

## 2019-12-26 DIAGNOSIS — Z20822 Contact with and (suspected) exposure to covid-19: Secondary | ICD-10-CM | POA: Insufficient documentation

## 2019-12-26 LAB — SARS CORONAVIRUS 2 (TAT 6-24 HRS): SARS Coronavirus 2: NEGATIVE

## 2019-12-27 ENCOUNTER — Telehealth: Payer: Self-pay | Admitting: Surgical

## 2019-12-27 NOTE — Telephone Encounter (Signed)
Called patient to let her know that Dr. Logan Bores would like her to be at hospital in morning at 6:30 AM instead of 8 AM. Patient verbalized understanding.

## 2019-12-28 ENCOUNTER — Encounter: Payer: Medicaid Other | Admitting: Obstetrics and Gynecology

## 2019-12-28 ENCOUNTER — Other Ambulatory Visit: Payer: Self-pay

## 2019-12-28 ENCOUNTER — Encounter: Payer: Self-pay | Admitting: Obstetrics and Gynecology

## 2019-12-28 ENCOUNTER — Other Ambulatory Visit: Payer: Medicaid Other

## 2019-12-28 ENCOUNTER — Inpatient Hospital Stay
Admission: EM | Admit: 2019-12-28 | Discharge: 2019-12-30 | DRG: 806 | Disposition: A | Discharge status: Home / Self Care | Payer: Medicaid Other | Attending: Obstetrics and Gynecology | Admitting: Obstetrics and Gynecology

## 2019-12-28 DIAGNOSIS — O99324 Drug use complicating childbirth: Secondary | ICD-10-CM | POA: Diagnosis present

## 2019-12-28 DIAGNOSIS — O480 Post-term pregnancy: Secondary | ICD-10-CM | POA: Diagnosis present

## 2019-12-28 DIAGNOSIS — F1290 Cannabis use, unspecified, uncomplicated: Secondary | ICD-10-CM | POA: Diagnosis present

## 2019-12-28 DIAGNOSIS — O9902 Anemia complicating childbirth: Secondary | ICD-10-CM | POA: Diagnosis present

## 2019-12-28 DIAGNOSIS — D573 Sickle-cell trait: Secondary | ICD-10-CM | POA: Diagnosis present

## 2019-12-28 DIAGNOSIS — Z3A40 40 weeks gestation of pregnancy: Secondary | ICD-10-CM | POA: Diagnosis not present

## 2019-12-28 LAB — CBC
HCT: 29.1 % — ABNORMAL LOW (ref 36.0–46.0)
Hemoglobin: 10 g/dL — ABNORMAL LOW (ref 12.0–15.0)
MCH: 26.4 pg (ref 26.0–34.0)
MCHC: 34.4 g/dL (ref 30.0–36.0)
MCV: 76.8 fL — ABNORMAL LOW (ref 80.0–100.0)
Platelets: 275 10*3/uL (ref 150–400)
RBC: 3.79 MIL/uL — ABNORMAL LOW (ref 3.87–5.11)
RDW: 14.5 % (ref 11.5–15.5)
WBC: 10.2 10*3/uL (ref 4.0–10.5)
nRBC: 0 % (ref 0.0–0.2)

## 2019-12-28 LAB — URINE DRUG SCREEN, QUALITATIVE (ARMC ONLY)
Amphetamines, Ur Screen: NOT DETECTED
Barbiturates, Ur Screen: NOT DETECTED
Benzodiazepine, Ur Scrn: NOT DETECTED
Cannabinoid 50 Ng, Ur ~~LOC~~: NOT DETECTED
Cocaine Metabolite,Ur ~~LOC~~: NOT DETECTED
MDMA (Ecstasy)Ur Screen: NOT DETECTED
Methadone Scn, Ur: NOT DETECTED
Opiate, Ur Screen: NOT DETECTED
Phencyclidine (PCP) Ur S: NOT DETECTED
Tricyclic, Ur Screen: NOT DETECTED

## 2019-12-28 LAB — TYPE AND SCREEN
ABO/RH(D): B POS
Antibody Screen: NEGATIVE

## 2019-12-28 LAB — RPR: RPR Ser Ql: NONREACTIVE

## 2019-12-28 MED ORDER — ONDANSETRON HCL 4 MG/2ML IJ SOLN: 4.0000 mg | Freq: Four times a day (QID) | INTRAMUSCULAR | Status: DC | PRN

## 2019-12-28 MED ORDER — BENZOCAINE-MENTHOL 20-0.5 % EX AERO: 1.0000 "application " | INHALATION_SPRAY | CUTANEOUS | Status: DC | PRN

## 2019-12-28 MED ORDER — ACETAMINOPHEN 325 MG PO TABS: 650.0000 mg | ORAL_TABLET | ORAL | Status: DC | PRN

## 2019-12-28 MED ORDER — SIMETHICONE 80 MG PO CHEW: 80.0000 mg | CHEWABLE_TABLET | ORAL | Status: DC | PRN

## 2019-12-28 MED ORDER — OXYTOCIN 40 UNITS IN NORMAL SALINE INFUSION - SIMPLE MED
1.0000 m[IU]/min | INTRAVENOUS | Status: DC
  Administered 2019-12-28: 11:00:00 2 m[IU]/min via INTRAVENOUS

## 2019-12-28 MED ORDER — LIDOCAINE HCL (PF) 1 % IJ SOLN: 30.0000 mL | INTRAMUSCULAR | Status: DC | PRN

## 2019-12-28 MED ORDER — MISOPROSTOL 200 MCG PO TABS
ORAL_TABLET | ORAL | Status: AC
  Filled 2019-12-28: qty 4

## 2019-12-28 MED ORDER — IBUPROFEN 600 MG PO TABS
600.0000 mg | ORAL_TABLET | Freq: Four times a day (QID) | ORAL | Status: DC
  Administered 2019-12-28 – 2019-12-30 (×7): 600 mg via ORAL
  Filled 2019-12-28 (×8): qty 1

## 2019-12-28 MED ORDER — OXYTOCIN 10 UNIT/ML IJ SOLN
INTRAMUSCULAR | Status: AC
  Filled 2019-12-28: qty 2

## 2019-12-28 MED ORDER — AMMONIA AROMATIC IN INHA
RESPIRATORY_TRACT | Status: AC
  Filled 2019-12-28: qty 10

## 2019-12-28 MED ORDER — PRENATAL MULTIVITAMIN CH
1.0000 | ORAL_TABLET | Freq: Every day | ORAL | Status: DC
  Administered 2019-12-30 (×2): 1 via ORAL
  Filled 2019-12-28 (×2): qty 1

## 2019-12-28 MED ORDER — OXYCODONE-ACETAMINOPHEN 5-325 MG PO TABS: 1.0000 | ORAL_TABLET | ORAL | Status: DC | PRN

## 2019-12-28 MED ORDER — LACTATED RINGERS IV SOLN
500.0000 mL | INTRAVENOUS | Status: DC | PRN
  Administered 2019-12-28: 08:00:00 1000 mL via INTRAVENOUS
  Administered 2019-12-28: 500 mL via INTRAVENOUS

## 2019-12-28 MED ORDER — OXYTOCIN 40 UNITS IN NORMAL SALINE INFUSION - SIMPLE MED: 2.5000 [IU]/h | INTRAVENOUS | Status: DC | PRN

## 2019-12-28 MED ORDER — DOCUSATE SODIUM 100 MG PO CAPS
100.0000 mg | ORAL_CAPSULE | Freq: Two times a day (BID) | ORAL | Status: DC
  Administered 2019-12-28 – 2019-12-30 (×4): 100 mg via ORAL
  Filled 2019-12-28 (×4): qty 1

## 2019-12-28 MED ORDER — DIPHENHYDRAMINE HCL 25 MG PO CAPS: 25.0000 mg | ORAL_CAPSULE | Freq: Four times a day (QID) | ORAL | Status: DC | PRN

## 2019-12-28 MED ORDER — TETANUS-DIPHTH-ACELL PERTUSSIS 5-2.5-18.5 LF-MCG/0.5 IM SUSP: 0.5000 mL | Freq: Once | INTRAMUSCULAR | Status: DC

## 2019-12-28 MED ORDER — OXYCODONE-ACETAMINOPHEN 5-325 MG PO TABS: 2.0000 | ORAL_TABLET | ORAL | Status: DC | PRN

## 2019-12-28 MED ORDER — OXYTOCIN BOLUS FROM INFUSION: 500.0000 mL | Freq: Once | INTRAVENOUS | Status: DC

## 2019-12-28 MED ORDER — ACETAMINOPHEN 325 MG PO TABS
650.0000 mg | ORAL_TABLET | ORAL | Status: DC | PRN
  Administered 2019-12-30 (×2): 650 mg via ORAL
  Filled 2019-12-28 (×2): qty 2

## 2019-12-28 MED ORDER — SOD CITRATE-CITRIC ACID 500-334 MG/5ML PO SOLN: 30.0000 mL | ORAL | Status: DC | PRN

## 2019-12-28 MED ORDER — BUTORPHANOL TARTRATE 1 MG/ML IJ SOLN: 1.0000 mg | INTRAMUSCULAR | Status: DC | PRN

## 2019-12-28 MED ORDER — MISOPROSTOL 100 MCG PO TABS
50.0000 ug | ORAL_TABLET | ORAL | Status: DC | PRN
  Filled 2019-12-28 (×2): qty 1

## 2019-12-28 MED ORDER — LACTATED RINGERS IV SOLN: INTRAVENOUS | Status: DC

## 2019-12-28 MED ORDER — OXYTOCIN 40 UNITS IN NORMAL SALINE INFUSION - SIMPLE MED
2.5000 [IU]/h | INTRAVENOUS | Status: DC
  Filled 2019-12-28: qty 1000

## 2019-12-28 MED ORDER — ZOLPIDEM TARTRATE 5 MG PO TABS: 5.0000 mg | ORAL_TABLET | Freq: Every evening | ORAL | Status: DC | PRN

## 2019-12-28 MED ORDER — TERBUTALINE SULFATE 1 MG/ML IJ SOLN
0.2500 mg | Freq: Once | INTRAMUSCULAR | Status: DC | PRN
  Filled 2019-12-28: qty 1

## 2019-12-28 MED ORDER — LIDOCAINE HCL (PF) 1 % IJ SOLN
INTRAMUSCULAR | Status: AC
  Filled 2019-12-28: qty 30

## 2019-12-28 MED ORDER — TERBUTALINE SULFATE 1 MG/ML IJ SOLN: 0.2500 mg | Freq: Once | INTRAMUSCULAR | Status: DC | PRN

## 2019-12-28 NOTE — Progress Notes (Signed)
MD notified  

## 2019-12-28 NOTE — Progress Notes (Signed)
Dr Logan Bores to check cervix

## 2019-12-28 NOTE — H&P (Signed)
History and Physical   HPI  Terri Bell is a 34 y.o. [id number] at [redacted]w[redacted]d Estimated Date of Delivery: 12/25/19 who is being admitted for induction of labor but presented with c/o contractions since @5AM  and loss of mucous plug @3AM .   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     PROBLEM LIST  Pregnancy complications or risks: Patient Active Problem List   Diagnosis Date Noted  . Post-dates pregnancy 12/28/2019  . Pelvic pain 09/20/2019  . Sickle cell trait in mother affecting pregnancy (HCC) 06/21/2019  . 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 ASCUS with positive high risk HPV cervical 02/15/2015    Prenatal labs and studies: ABO, Rh: --/--/PENDING (04/28 11/16/2015) Antibody: PENDING (04/28 08-18-1970) Rubella: 15.40 (10/05 1102) RPR: Non Reactive (02/04 0824)  HBsAg: Negative (10/05 1102)  HIV: Non Reactive (10/05 1102)  GBS:--/Negative (04/08 0000)   Past Medical History:  Diagnosis Date  . Increased BMI   . Mastitis   . Medical history non-contributory   . Sickle cell trait (HCC) increased bmi  . Vaginal Pap smear, abnormal 08/02/2014   ascus/pos hpv- normal colpo; repeat 3 months pp     Past Surgical History:  Procedure Laterality Date  . NO PAST SURGERIES       Medications    Current Discharge Medication List    CONTINUE these medications which have NOT CHANGED   Details  Prenatal Vit-Fe Fumarate-FA (PRENATAL MULTIVITAMIN) TABS tablet Take  1 tablet by mouth daily at 12 noon.         Allergies  Amoxicillin and Penicillins  Review of Systems  [place] trait  Physical Exam  BP 112/63 (BP Location: Left Arm)   Pulse 71   Temp 98.2 F (36.8 C)   Resp 16   Ht 5\' 2"  (1.575 m)   Wt 98.9 kg   LMP  (LMP Unknown)   BMI 39.87 kg/m   Lungs:  CTA B Cardio: RRR without M/R/G Abd: Soft, gravid, NT Presentation: cephalic EXT: No C/C/ 1+ Edema DTRs: 2+ B CERVIX: Dilation: 4 Effacement (%): 70 Station: -2 Presentation: Vertex Exam by:: 02-16-2003, MD  See Prenatal records for more detailed PE.     FHR:  Variability: Good {> 6 bpm)      Toco: Uterine Contractions: Q 3-5 min.  Mod.  Test Results  Results for orders placed or performed during the hospital encounter of 12/28/19 (from the past 24 hour(s))  CBC     Status: Abnormal   Collection Time: 12/28/19  6:35 AM  Result Value Ref Range  WBC 10.2 4.0 - 10.5 K/uL   RBC 3.79 (L) 3.87 - 5.11 MIL/uL   Hemoglobin 10.0 (L) 12.0 - 15.0 g/dL   HCT 29.1 (L) 36.0 - 46.0 %   MCV 76.8 (L) 80.0 - 100.0 fL   MCH 26.4 26.0 - 34.0 pg   MCHC 34.4 30.0 - 36.0 g/dL   RDW 14.5 11.5 - 15.5 %   Platelets 275 150 - 400 K/uL   nRBC 0.0 0.0 - 0.2 %  Type and screen     Status: None (Preliminary result)   Collection Time: 12/28/19  6:35 AM  Result Value Ref Range   ABO/RH(D) PENDING    Antibody Screen PENDING    Sample Expiration      12/31/2019,2359 Performed at [facility], [address] [place], [place] [address]    AROM - clear fluid noted.  Assessment   [id number] at [redacted]w[redacted]d Estimated Date of Delivery: 12/25/19  The fetus is reassuring.   Patient Active Problem List   Diagnosis Date Noted  . Post-dates pregnancy 12/28/2019  . Pelvic pain 09/20/2019  . Sickle cell trait in mother affecting pregnancy ([place]) 06/21/2019  . 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 ASCUS with positive high risk HPV cervical 02/15/2015    Plan  1. Admit to L&D :   2. EFM: -- Category  1 3. Stadol or Epidural if desired.   4. Admission labs  5. Expectant management - follow for spon. Labor - consider misoprostol/pitocin  Finis Bud, M.D. 12/28/2019 7:53 AM

## 2019-12-28 NOTE — Progress Notes (Signed)
Ambulated to SCN with RN and support person to get update from Neo and attempt breastfeeding.

## 2019-12-28 NOTE — Progress Notes (Signed)
This note also relates to the following rows which could not be included: SpO2 - Cannot attach notes to unvalidated device data  Spoke to Dr Logan Bores, plan is to start fluid bolus 500cc, position change, do not start pitocin at this time. He will continue to monitor remotely.

## 2019-12-28 NOTE — Lactation Note (Signed)
This note was copied from a baby's chart. Lactation Consultation Note  Patient Name: Terri Bell [id number] Date: 12/28/2019 Reason for consult: Initial assessment;Mother's request;Term  LC called in to [facility] for mom's first breastfeeding attempt with her 4th baby. Mom reports varied breastfeeding experience durations ranging from 2-6 months with her other children citing low milk supply and pain for her main reasons for stopping. Baby was skin to skin with mom showing early hunger cues. LC brought mom up farther in bed, set up the back of bed for support and good feeding position for baby. Mom preferring cradle hold, brought baby to right breast first. Baby was spitting and had clear fluid in his mouth making latch initially difficult, and had sounded congested when breathing. LC held breast tissue in tea cup hold and baby was able to sustain latch, began strong rhythmic sucking pattern and had flanged top/bottom lips. Baby fed well for approximately 15-17 minutes before coming off. Mom attempted to place baby skin to skin, but he began to cue once more, LC assisted with football hold on left breast, again supporting breast tissue in tea cup hold to sustain latch, baby ended feed back on right breast for a total feeding time of 30 minutes. Baby transitioned skin to skin with mom for a brief period before being weighed. LC reviewed breastfeeding basics: newborn feeding patterns, impact that fluid/mucous can have on feeding, early hunger cues, stomach size, wet/stool diapers, and signs of contentment.  LC number written on whiteboard in LDR, encouraged to call as needed for ongoing breastfeeding support.  Maternal Data Formula Feeding for Exclusion: No Has patient been taught Hand Expression?: Yes Does the patient have breastfeeding experience prior to this delivery?: Yes  Feeding Feeding Type: Breast Fed  LATCH Score Latch: Repeated attempts needed to sustain latch, nipple held in mouth  throughout feeding, stimulation needed to elicit sucking reflex.  Audible Swallowing: None  Type of Nipple: Everted at rest and after stimulation  Comfort (Breast/Nipple): Soft / non-tender  Hold (Positioning): Assistance needed to correctly position infant at breast and maintain latch.  LATCH Score: 6  Interventions Interventions: Breast feeding basics reviewed;Assisted with latch;Hand express;Breast compression;Adjust position;Support pillows;Position options  Lactation Tools Discussed/Used     Consult Status Consult Status: Follow-up Date: 12/28/19 Follow-up type: In-patient    Danford Bad 12/28/2019, 4:05 PM

## 2019-12-29 ENCOUNTER — Encounter: Payer: Self-pay | Admitting: Obstetrics and Gynecology

## 2019-12-29 DIAGNOSIS — O480 Post-term pregnancy: Principal | ICD-10-CM

## 2019-12-29 DIAGNOSIS — Z3A40 40 weeks gestation of pregnancy: Secondary | ICD-10-CM | POA: Diagnosis not present

## 2019-12-29 MED ORDER — COCONUT OIL OIL: 1.0000 "application " | TOPICAL_OIL | Status: DC | PRN

## 2019-12-29 NOTE — Progress Notes (Signed)
Patient ID: Terri Bell, female   DOB: [birth year], 34 y.o.   MRN: [id number]    Progress Note - Vaginal Delivery  Terri Bell is a 34 y.o. [id number] now PP day 1 s/p Vaginal, Spontaneous .   Subjective:  The patient reports no complaints, up ad lib, voiding and tolerating PO We have further discussed her baby and baby's health.  We have also discussed trying to extend her hospital stay so that she may stay with her baby and continue to breast-feed. I checked with the nursery and they have confirmed by ultrasound that it is a boy baby.  They have also explained that there is a very good chance the baby will stay 3 to 5 days awaiting test results.  They have said that there is a very high probability that mom can staying continue to breast-feed the baby - " rooming in"  Objective:  Vital signs in last 24 hours: Temp:  [97.8 F (36.6 C)-99 F (37.2 C)] 98.8 F (37.1 C) (04/29 0744) Pulse Rate:  [57-76] 68 (04/29 0744) Resp:  [18-20] 20 (04/29 0744) BP: (95-115)/(56-73) 106/58 (04/29 0744) SpO2:  [100 %] 100 % (04/29 0744)  Physical Exam:  General: alert and cooperative Lochia: appropriate Uterine Fundus: firm    Data Review Recent Labs    12/28/19 0635  HGB 10.0*  HCT 29.1*    Assessment/Plan: Active Problems:   Post-dates pregnancy   Plan for discharge tomorrow With high likelihood of rooming in to stay with the baby.  -- Continue routine PP care.     Elonda Husky, M.D. 12/29/2019 8:30 AM

## 2019-12-29 NOTE — Lactation Note (Signed)
Lactation Consultation Note  Patient Name: Terri Bell Date: 12/29/2019 Reason for consult: Follow-up assessment;NICU baby;Term   [facility] student in for assistance setting up DEBP due to infant separation. Baby is in SCN for low blood sugars. Mother expresses concerns for currently having low-supply since she experienced copious amounts of milk during the early postpartum period with her prior pregnancies.  Assisted mother with set-up of DEBP using size 27 flanges. Mother reports feeling comfortable during pumping session. [facility] student noticed that her right nipple may slightly be rubbing on the flange towards the end of the pumping session. Advised that mother may need to use flange size 30 overnight if she starts to notice her nipple rubbing at the center of flange. Offered coconut oil as well and discussed safety and when to apply to nipples.  Encouraged mother to verify with SCN the feeding plan and if baby will be going to breast at all. Instructed to always place baby to breast first, if given the option and to follow-up with pumping. Encouraged to pump and hand express Q2-3hrs while separated from baby. Mother able to return demonstrate how to hand express colostrum. Drops expressed from both breasts and collected to bring to nursery. Reassured mother that frequent breast stimulation will facilitate her breastmilk supply and discussed the normal time frame for milk supply to fully come in.  Pumping frequency, cleaning, and milk storage reviewed with mother. LC number remains on whiteboard. Instructed mother to call with any concerns or questions that arise for continued support. Mother comfortable with current plan in place.  Maternal Data Formula Feeding for Exclusion: No Has patient been taught Hand Expression?: Yes Does the patient have breastfeeding experience prior to this delivery?: Yes  Feeding    LATCH Score                   Interventions Interventions: Breast  feeding basics reviewed;Breast massage;Hand express;Position options;Expressed milk;Coconut oil;Comfort gels;Hand pump;DEBP  Lactation Tools Discussed/Used Tools: Coconut oil;Pump;Flanges;Comfort gels Flange Size: 27;30 Breast pump type: Double-Electric Breast Pump Pump Review: Setup, frequency, and cleaning;Milk Storage Initiated by:: Satira Anis Student  Date initiated:: 12/29/19   Consult Status Consult Status: Follow-up Date: 12/29/19 Follow-up type: In-patient    Corlis Hove - [facility] Student  12/29/2019, 4:16 PM

## 2019-12-30 DIAGNOSIS — Z3A40 40 weeks gestation of pregnancy: Secondary | ICD-10-CM

## 2019-12-30 DIAGNOSIS — O480 Post-term pregnancy: Principal | ICD-10-CM

## 2019-12-30 NOTE — Progress Notes (Signed)
Discharge order received from doctor. Reviewed discharge instructions and medications with patient and answered all questions. Follow up appointment instructions given. Patient verbalized understanding. ID bands checked. Patient discharged home with infant (Infant discharged by SCN at same time as motehr) via wheelchair by nursing/auxillary.    Oswald Hillock, RN

## 2019-12-30 NOTE — Lactation Note (Signed)
Mom states she has been breastfeeding baby and using SNS for formula supp, last feeding she became frustrated with latching baby with SNS and just breastfed and supplemented baby with formula in bottle after, she hopes to be discharged to home with baby today, she states she has a breast pump for home use and also has WIC in [place]  I offered to assist her with any breastfeeding concerns and she stated she would let me know if she needed assistance.  I wrote my name and phone no. on white board in room.

## 2019-12-30 NOTE — Discharge Instructions (Signed)

## 2019-12-30 NOTE — Discharge Summary (Signed)
Patient Name: Terri Bell DOB: 1986-07-30 MRN: [id number]                            Discharge Summary  Date of Admission: 12/28/2019 Date of Discharge: 12/30/2019 Delivering Provider: Linzie Collin   Admitting Diagnosis: Post-dates pregnancy [O48.0] at [redacted]w[redacted]d Secondary diagnosis:  Active Problems:   Post-dates pregnancy   Mode of Delivery: normal spontaneous vaginal delivery              Discharge diagnosis: Term Pregnancy Delivered      Post partum procedures:   Complications: none                     Discharge Day SOAP Note:  Progress Note - Vaginal Delivery  Terri Bell is a 34 y.o. [id number] now PP day 2 s/p Vaginal, Spontaneous . Delivery was uncomplicated  Subjective  The patient has the following complaints: has no unusual complaints  Pain is controlled with current medications.   Patient is urinating without difficulty.  She is ambulating well.   Workup continues for the baby but discharge OK and will continue as outpatient.  Objective  Vital signs: BP 122/86   Pulse 68   Temp 98.5 F (36.9 C) (Oral)   Resp 20   Ht 5\' 2"  (1.575 m)   Wt 98.9 kg   LMP  (LMP Unknown)   SpO2 100%   Breastfeeding Unknown   BMI 39.87 kg/m   Physical Exam: Gen: NAD Fundus Fundal Tone: Firm  Lochia Amount: Scant        Data Review Labs: Lab Results  Component Value Date   WBC 10.2 12/28/2019   HGB 10.0 (L) 12/28/2019   HCT 29.1 (L) 12/28/2019   MCV 76.8 (L) 12/28/2019   PLT 275 12/28/2019   CBC Latest Ref Rng & Units 12/28/2019 10/06/2019 06/21/2019  WBC 4.0 - 10.5 K/uL 10.2 9.4 6.6  Hemoglobin 12.0 - 15.0 g/dL 10.0(L) 10.6(L) 11.7  Hematocrit 36.0 - 46.0 % 29.1(L) 30.5(L) 33.7(L)  Platelets 150 - 400 K/uL 275 250 316   B POS  Edinburgh Score: Edinburgh Postnatal Depression Scale Screening Tool 12/29/2019  I have been able to laugh and see the funny side of things. 0  I have looked forward with enjoyment to things. 0  I have blamed myself  unnecessarily when things went wrong. 2  I have been anxious or worried for no good reason. 1  I have felt scared or panicky for no good reason. 0  Things have been getting on top of me. 1  I have been so unhappy that I have had difficulty sleeping. 0  I have felt sad or miserable. 0  I have been so unhappy that I have been crying. 0  The thought of harming myself has occurred to me. 0  Edinburgh Postnatal Depression Scale Total 4    Assessment/Plan  Active Problems:   Post-dates pregnancy    Plan for discharge today.  Discharge Instructions: Per After Visit Summary. Activity: Advance as tolerated. Pelvic rest for 6 weeks.  Also refer to After Visit Summary Diet: Regular Medications: Allergies as of 12/30/2019      Reactions   Amoxicillin Hives   Penicillins Hives      Medication List    TAKE these medications   prenatal multivitamin Tabs tablet Take 1 tablet by mouth daily at 12 noon.  Outpatient follow up:  Follow-up Information    Harlin Heys, MD Follow up in 6 week(s).   Specialties: Obstetrics and Gynecology, Radiology Contact information: [address] [place] [address] [phone]          Postpartum contraception: Will discuss at first office visit post-partum  Pt interested in long term birth control like IUD  Discharged Condition: good  Discharged to: home  Newborn Data: Disposition:home with mother  Apgars: APGAR (1 MIN): 7   APGAR (5 MINS): 9   APGAR (10 MINS):    Baby Feeding: Breast    Finis Bud, M.D. 12/30/2019 8:09 AM

## 2019-12-30 NOTE — Progress Notes (Addendum)
   12/30/19 1030  Clinical Encounter Type  Visited With Patient  Visit Type Initial;Spiritual support  Referral From Nurse  Consult/Referral To Chaplain  Per suggestion of nurse in [place], chaplain went in to speak with mother. Chaplain had a nice visit with her. Patient talked about her three sons at home and how the younger two are waiting to give their new baby brother some cereal. Patient mentioned that the brothers are excited about Blanca Friend. When asked how she was doing she said fine. Chaplain asked about baby and mother said that he has some cosmetic difference below. She said that her and FOB were upset, but she is okay now. Chaplain asked if she could pray with her and she said yes. Chaplain prayed that God would bless Blanca Friend and that he would give his parents peace. Chaplain told patient to be encouraged and enjoy her Denyse Amass.  Chaplain stopped in again and met FOB. MOB was nursing and preparing for discharge. Nurse was giving discharge instructions. Chaplain was happy to meet FOB.

## 2020-02-02 ENCOUNTER — Ambulatory Visit (INDEPENDENT_AMBULATORY_CARE_PROVIDER_SITE_OTHER): Payer: Medicaid Other | Admitting: Obstetrics and Gynecology

## 2020-02-02 ENCOUNTER — Encounter: Payer: Self-pay | Admitting: Obstetrics and Gynecology

## 2020-02-02 ENCOUNTER — Other Ambulatory Visit: Payer: Self-pay

## 2020-02-02 DIAGNOSIS — Z3009 Encounter for other general counseling and advice on contraception: Secondary | ICD-10-CM

## 2020-02-02 NOTE — Progress Notes (Signed)
HPI:      [name] is a 34 y.o. [id number] who LMP was No LMP recorded. (Menstrual status: Lactating).  Subjective:   She presents today approximately 6 weeks postpartum.  She states that she has had bleeding on and off for the whole 6 weeks.  She has not resumed intercourse.  She desires IUD for birth control.  She is breast and bottlefeeding.  Reports that her baby is doing well.  Baby is scheduled for surgery approximately 19 months of age.  He has been evaluated and will require 2 surgical procedures.    Hx: The following portions of the patient's history were reviewed and updated as appropriate:             She  has a past medical history of Increased BMI, Mastitis, Medical history non-contributory, Sickle cell trait (HCC) (increased bmi), and Vaginal Pap smear, abnormal (08/02/2014). She does not have any pertinent problems on file. She  has a past surgical history that includes No past surgeries. Her family history includes Healthy in her father and mother; Thyroid disease in her paternal aunt. She  reports that she has quit smoking. She has never used smokeless tobacco. She reports previous alcohol use. She reports that she does not use drugs. She has a current medication list which includes the following prescription(s): prenatal multivitamin. She is allergic to amoxicillin and penicillins.       Review of Systems:  Review of Systems  Constitutional: Denied constitutional symptoms, night sweats, recent illness, fatigue, fever, insomnia and weight loss.  Eyes: Denied eye symptoms, eye pain, photophobia, vision change and visual disturbance.  Ears/Nose/Throat/Neck: Denied ear, nose, throat or neck symptoms, hearing loss, nasal discharge, sinus congestion and sore throat.  Cardiovascular: Denied cardiovascular symptoms, arrhythmia, chest pain/pressure, edema, exercise intolerance, orthopnea and palpitations.  Respiratory: Denied pulmonary symptoms, asthma, pleuritic pain,  productive sputum, cough, dyspnea and wheezing.  Gastrointestinal: Denied, gastro-esophageal reflux, melena, nausea and vomiting.  Genitourinary: Denied genitourinary symptoms including symptomatic vaginal discharge, pelvic relaxation issues, and urinary complaints.  Musculoskeletal: Denied musculoskeletal symptoms, stiffness, swelling, muscle weakness and myalgia.  Dermatologic: Denied dermatology symptoms, rash and scar.  Neurologic: Denied neurology symptoms, dizziness, headache, neck pain and syncope.  Psychiatric: Denied psychiatric symptoms, anxiety and depression.  Endocrine: Denied endocrine symptoms including hot flashes and night sweats.   Meds:   Current Outpatient Medications on File Prior to Visit  Medication Sig Dispense Refill  . Prenatal Vit-Fe Fumarate-FA (PRENATAL MULTIVITAMIN) TABS tablet Take 1 tablet by mouth daily at 12 noon.     No current facility-administered medications on file prior to visit.    Objective:     Vitals:   02/02/20 1000  BP: (!) 130/97  Pulse: 76              Physical examination   Pelvic:   Vulva: Normal appearance.  No lesions.  Vagina: No lesions or abnormalities noted.  Support: Normal pelvic support.  Urethra No masses tenderness or scarring.  Meatus Normal size without lesions or prolapse.  Cervix: Normal appearance.  No lesions.  Anus: Normal exam.  No lesions.  Perineum: Normal exam.  No lesions.        Bimanual   Uterus: Normal size.  Non-tender.  Mobile.  AV.  Adnexae: No masses.  Non-tender to palpation.  Cul-de-sac: Negative for abnormality.     Assessment:    [id number] Patient Active Problem List   Diagnosis Date Noted  . Post-dates pregnancy 12/28/2019  .  Pelvic pain 09/20/2019  . Sickle cell trait in mother affecting pregnancy ([place]) 06/21/2019  . 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 ASCUS with positive high risk HPV cervical 02/15/2015     1. Postpartum care and examination immediately after delivery   2. Birth  control counseling     Patient doing well postpartum.   Plan:            1.  Follow-up for IUD  2.  Patient may resume normal activities with exception of heavy lifting.  She is planning to go back to work next week. Orders No orders of the defined types were placed in this encounter.   No orders of the defined types were placed in this encounter.     F/U  Return in about 2 weeks (around 02/16/2020) for Annual Physical.  Terri Bell, M.D. 02/02/2020 10:30 AM

## 2020-02-07 ENCOUNTER — Encounter: Payer: Medicaid Other | Admitting: Obstetrics and Gynecology

## 2020-02-22 ENCOUNTER — Encounter: Payer: Self-pay | Admitting: Obstetrics and Gynecology

## 2020-02-22 ENCOUNTER — Ambulatory Visit (INDEPENDENT_AMBULATORY_CARE_PROVIDER_SITE_OTHER): Payer: Medicaid Other | Admitting: Obstetrics and Gynecology

## 2020-02-22 ENCOUNTER — Other Ambulatory Visit: Payer: Self-pay

## 2020-02-22 VITALS — BP 119/87 | HR 81 | Ht 62.0 in | Wt 207.6 lb

## 2020-02-22 DIAGNOSIS — Z30430 Encounter for insertion of intrauterine contraceptive device: Secondary | ICD-10-CM | POA: Diagnosis not present

## 2020-02-22 NOTE — Progress Notes (Addendum)
HPI:      [name] is a 34 y.o. [id number] who LMP was Patient's last menstrual period was 02/16/2020.  Subjective:   She presents today for IUD insertion.  She has discontinued breast-feeding and is now strictly bottlefeeding.  She is on the last day of her menstrual period. 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     Hx: The following portions of the patient's history were reviewed and updated as appropriate:             She  has a past medical history of Increased BMI, Mastitis, Medical history non-contributory, Sickle cell trait ([place]) (increased bmi), and Vaginal Pap smear, abnormal (08/02/2014). She does not have any pertinent problems on file. She  has a past surgical history that includes No past surgeries. Her family history includes Healthy in her father and mother; Thyroid disease in her paternal aunt. She  reports that she has quit smoking. She has never used smokeless tobacco. She reports previous alcohol use. She reports that she does not use drugs. She has a current medication list which includes the following prescription(s): prenatal multivitamin. She is allergic to amoxicillin and penicillins.       Review of Systems:  Review of Systems  Constitutional: Denied constitutional symptoms, night sweats, recent illness, fatigue, fever, insomnia and weight loss.  Eyes: Denied eye symptoms, eye pain, photophobia, vision change and visual disturbance.  Ears/Nose/Throat/Neck: Denied ear, nose, throat or neck symptoms, hearing loss, nasal discharge, sinus congestion and sore throat.  Cardiovascular: Denied cardiovascular symptoms, arrhythmia, chest pain/pressure, edema, exercise intolerance, orthopnea and palpitations.  Respiratory: Denied pulmonary symptoms, asthma, pleuritic pain, productive sputum, cough, dyspnea and wheezing.  Gastrointestinal: Denied,  gastro-esophageal reflux, melena, nausea and vomiting.  Genitourinary: Denied genitourinary symptoms including symptomatic vaginal discharge, pelvic relaxation issues, and urinary complaints.  Musculoskeletal: Denied musculoskeletal symptoms, stiffness, swelling, muscle weakness and myalgia.  Dermatologic: Denied dermatology symptoms, rash and scar.  Neurologic: Denied neurology symptoms, dizziness, headache, neck pain and syncope.  Psychiatric: Denied psychiatric symptoms, anxiety and depression.  Endocrine: Denied endocrine symptoms including hot flashes and night sweats.   Meds:   Current Outpatient Medications on File Prior to Visit  Medication Sig Dispense Refill  . Prenatal Vit-Fe Fumarate-FA (PRENATAL MULTIVITAMIN) TABS tablet Take 1 tablet by mouth daily at 12 noon.     No current facility-administered medications on file prior to visit.    Objective:     Vitals:   02/22/20 1142  BP: 119/87  Pulse: 81    Physical examination   Pelvic:   Vulva: Normal appearance.  No lesions.  Vagina: No lesions or abnormalities noted.  Support: Normal pelvic support.  Urethra No masses tenderness or scarring.  Meatus Normal size without lesions or prolapse.  Cervix: Normal appearance.  No lesions.  Anus: Normal exam.  No lesions.  Perineum: Normal exam.  No lesions.        Bimanual   Uterus: Normal size.  Non-tender.  Mobile.  AV.  Adnexae: No masses.  Non-tender to palpation.  Cul-de-sac: Negative for abnormality.   IUD Procedure Pt has read the booklet and signed the appropriate forms regarding the Mirena IUD.  All of her questions have been answered.   The cervix was cleansed with betadine solution.  After sounding the uterus and noting the position, the IUD was placed in  the usual manner without problem.  The string was cut to the appropriate length.  The patient tolerated the procedure well.              Assessment:    [id number] Patient Active Problem List   Diagnosis Date  Noted  . Post-dates pregnancy 12/28/2019  . Pelvic pain 09/20/2019  . Sickle cell trait in mother affecting pregnancy ([facility]) 06/21/2019  . 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 ASCUS with positive high risk HPV cervical 02/15/2015     1. Encounter for insertion of mirena IUD       Plan:             F/U  Return in about 4 weeks (around 03/21/2020) for For IUD f/u.  Elonda Husky, M.D. 02/22/2020 12:06 PM

## 2020-03-21 ENCOUNTER — Encounter: Payer: Medicaid Other | Admitting: Obstetrics and Gynecology

## 2020-04-02 ENCOUNTER — Encounter: Payer: Self-pay | Admitting: Obstetrics and Gynecology
